# Patient Record
Sex: Male | Born: 1960 | Race: White | Hispanic: No | Marital: Single | Smoking: Current every day smoker
Health system: Southern US, Community
[De-identification: ages and names within clinical notes are randomized; demographics above are authoritative.]

## PROBLEM LIST (undated history)

## (undated) DIAGNOSIS — E785 Hyperlipidemia, unspecified: Secondary | ICD-10-CM

## (undated) DIAGNOSIS — K219 Gastro-esophageal reflux disease without esophagitis: Secondary | ICD-10-CM

## (undated) DIAGNOSIS — N400 Enlarged prostate without lower urinary tract symptoms: Secondary | ICD-10-CM

## (undated) DIAGNOSIS — K635 Polyp of colon: Secondary | ICD-10-CM

## (undated) DIAGNOSIS — IMO0002 Reserved for concepts with insufficient information to code with codable children: Secondary | ICD-10-CM

## (undated) DIAGNOSIS — K2970 Gastritis, unspecified, without bleeding: Secondary | ICD-10-CM

## (undated) DIAGNOSIS — K760 Fatty (change of) liver, not elsewhere classified: Secondary | ICD-10-CM

## (undated) DIAGNOSIS — K222 Esophageal obstruction: Secondary | ICD-10-CM

## (undated) DIAGNOSIS — Z86010 Personal history of colonic polyps: Secondary | ICD-10-CM

## (undated) DIAGNOSIS — K449 Diaphragmatic hernia without obstruction or gangrene: Secondary | ICD-10-CM

## (undated) HISTORY — DX: Benign prostatic hyperplasia without lower urinary tract symptoms: N40.0

## (undated) HISTORY — PX: TYMPANOSTOMY TUBE PLACEMENT: SHX32

## (undated) HISTORY — PX: TONSILLECTOMY: SUR1361

## (undated) HISTORY — DX: Gastritis, unspecified, without bleeding: K29.70

## (undated) HISTORY — DX: Polyp of colon: K63.5

## (undated) HISTORY — DX: Fatty (change of) liver, not elsewhere classified: K76.0

## (undated) HISTORY — DX: Diaphragmatic hernia without obstruction or gangrene: K44.9

## (undated) HISTORY — DX: Esophageal obstruction: K22.2

## (undated) HISTORY — DX: Personal history of colonic polyps: Z86.010

## (undated) HISTORY — DX: Hyperlipidemia, unspecified: E78.5

## (undated) HISTORY — DX: Gastro-esophageal reflux disease without esophagitis: K21.9

## (undated) HISTORY — DX: Reserved for concepts with insufficient information to code with codable children: IMO0002

---

## 2003-05-27 LAB — HM COLONOSCOPY

## 2004-02-16 ENCOUNTER — Encounter: Payer: Self-pay | Admitting: Internal Medicine

## 2004-04-10 ENCOUNTER — Ambulatory Visit: Payer: Self-pay | Admitting: Internal Medicine

## 2004-04-15 ENCOUNTER — Ambulatory Visit: Payer: Self-pay | Admitting: Gastroenterology

## 2004-04-29 ENCOUNTER — Ambulatory Visit: Payer: Self-pay | Admitting: Gastroenterology

## 2004-04-29 DIAGNOSIS — D126 Benign neoplasm of colon, unspecified: Secondary | ICD-10-CM

## 2004-04-29 DIAGNOSIS — Z860100 Personal history of colon polyps, unspecified: Secondary | ICD-10-CM

## 2004-04-29 DIAGNOSIS — K449 Diaphragmatic hernia without obstruction or gangrene: Secondary | ICD-10-CM | POA: Insufficient documentation

## 2004-04-29 HISTORY — DX: Personal history of colonic polyps: Z86.010

## 2004-04-29 HISTORY — DX: Personal history of colon polyps, unspecified: Z86.0100

## 2005-02-24 ENCOUNTER — Ambulatory Visit: Payer: Self-pay | Admitting: Internal Medicine

## 2005-03-03 ENCOUNTER — Ambulatory Visit: Payer: Self-pay

## 2005-07-21 ENCOUNTER — Ambulatory Visit: Payer: Self-pay | Admitting: Internal Medicine

## 2006-03-30 ENCOUNTER — Ambulatory Visit: Payer: Self-pay | Admitting: Internal Medicine

## 2006-06-01 ENCOUNTER — Ambulatory Visit: Payer: Self-pay | Admitting: Internal Medicine

## 2006-06-01 LAB — CONVERTED CEMR LAB
AST: 50 units/L — ABNORMAL HIGH (ref 0–37)
Cholesterol: 162 mg/dL (ref 0–200)
LDL Cholesterol: 77 mg/dL (ref 0–99)
PSA: 0.74 ng/mL (ref 0.10–4.00)

## 2006-06-15 ENCOUNTER — Ambulatory Visit: Payer: Self-pay | Admitting: Internal Medicine

## 2006-09-28 ENCOUNTER — Ambulatory Visit: Payer: Self-pay | Admitting: Internal Medicine

## 2006-09-28 LAB — CONVERTED CEMR LAB
ALT: 43 units/L — ABNORMAL HIGH (ref 0–40)
AST: 35 units/L (ref 0–37)
Albumin: 3.8 g/dL (ref 3.5–5.2)
HDL: 64.3 mg/dL (ref >39.0)
Total Bilirubin: 0.7 mg/dL (ref 0.3–1.2)
Triglycerides: 162 mg/dL — ABNORMAL HIGH (ref 0–149)

## 2006-10-26 ENCOUNTER — Ambulatory Visit: Payer: Self-pay | Admitting: Gastroenterology

## 2006-10-26 LAB — CONVERTED CEMR LAB
Basophils Absolute: 0 10*3/uL (ref 0.0–0.1)
Eosinophils Absolute: 0.3 10*3/uL (ref 0.0–0.6)
Eosinophils Relative: 2.8 % (ref 0.0–5.0)
HCV Ab: NEGATIVE
Hepatitis B Surface Ag: NEGATIVE
MCV: 98.7 fL (ref 78.0–100.0)
Monocytes Relative: 8.1 % (ref 3.0–11.0)
Platelets: 187 10*3/uL (ref 150–400)
RBC: 4.42 M/uL (ref 4.22–5.81)
Sed Rate: 4 mm/hr (ref 0–20)
WBC: 9.4 10*3/uL (ref 4.5–10.5)

## 2006-11-02 ENCOUNTER — Ambulatory Visit: Payer: Self-pay | Admitting: Gastroenterology

## 2006-11-09 ENCOUNTER — Encounter: Payer: Self-pay | Admitting: Internal Medicine

## 2006-11-09 ENCOUNTER — Ambulatory Visit: Payer: Self-pay | Admitting: Gastroenterology

## 2006-11-09 DIAGNOSIS — K222 Esophageal obstruction: Secondary | ICD-10-CM

## 2007-03-29 ENCOUNTER — Telehealth (INDEPENDENT_AMBULATORY_CARE_PROVIDER_SITE_OTHER): Payer: Self-pay | Admitting: *Deleted

## 2007-04-05 ENCOUNTER — Ambulatory Visit: Payer: Self-pay | Admitting: Internal Medicine

## 2007-04-05 LAB — CONVERTED CEMR LAB
ALT: 33 units/L (ref 0–53)
AST: 29 units/L (ref 0–37)
Alkaline Phosphatase: 50 units/L (ref 39–117)
Bilirubin, Direct: 0.1 mg/dL (ref 0.0–0.3)
Blood in Urine, dipstick: NEGATIVE
Eosinophils Absolute: 0.1 10*3/uL (ref 0.0–0.6)
Eosinophils Relative: 1.6 % (ref 0.0–5.0)
Glucose, Urine, Semiquant: NEGATIVE
HCT: 41 % (ref 39.0–52.0)
LDL Cholesterol: 93 mg/dL (ref 0–99)
Neutrophils Relative %: 51.6 % (ref 43.0–77.0)
Nitrite: NEGATIVE
Platelets: 207 10*3/uL (ref 150–400)
Protein, U semiquant: NEGATIVE
RBC: 4.03 M/uL — ABNORMAL LOW (ref 4.22–5.81)
RDW: 12.7 % (ref 11.5–14.6)
Total CHOL/HDL Ratio: 2.5
Total Protein: 6.5 g/dL (ref 6.0–8.3)
Triglycerides: 65 mg/dL (ref 0–149)
Urobilinogen, UA: NEGATIVE
VLDL: 13 mg/dL (ref 0–40)
WBC Urine, dipstick: NEGATIVE
WBC: 6.3 10*3/uL (ref 4.5–10.5)

## 2007-04-12 ENCOUNTER — Ambulatory Visit: Payer: Self-pay | Admitting: Internal Medicine

## 2007-04-12 DIAGNOSIS — E782 Mixed hyperlipidemia: Secondary | ICD-10-CM | POA: Insufficient documentation

## 2007-04-12 DIAGNOSIS — F17200 Nicotine dependence, unspecified, uncomplicated: Secondary | ICD-10-CM

## 2007-09-24 ENCOUNTER — Telehealth: Payer: Self-pay | Admitting: Internal Medicine

## 2007-09-28 ENCOUNTER — Ambulatory Visit: Payer: Self-pay | Admitting: Internal Medicine

## 2007-10-01 ENCOUNTER — Encounter: Payer: Self-pay | Admitting: Internal Medicine

## 2007-10-05 ENCOUNTER — Encounter (INDEPENDENT_AMBULATORY_CARE_PROVIDER_SITE_OTHER): Payer: Self-pay | Admitting: *Deleted

## 2007-10-11 ENCOUNTER — Encounter (INDEPENDENT_AMBULATORY_CARE_PROVIDER_SITE_OTHER): Payer: Self-pay | Admitting: *Deleted

## 2007-10-11 ENCOUNTER — Telehealth: Payer: Self-pay | Admitting: Internal Medicine

## 2007-10-21 DIAGNOSIS — Z862 Personal history of diseases of the blood and blood-forming organs and certain disorders involving the immune mechanism: Secondary | ICD-10-CM

## 2007-10-21 DIAGNOSIS — Z8639 Personal history of other endocrine, nutritional and metabolic disease: Secondary | ICD-10-CM

## 2007-10-21 DIAGNOSIS — J301 Allergic rhinitis due to pollen: Secondary | ICD-10-CM

## 2007-10-21 DIAGNOSIS — K219 Gastro-esophageal reflux disease without esophagitis: Secondary | ICD-10-CM

## 2007-10-25 ENCOUNTER — Telehealth: Payer: Self-pay | Admitting: Internal Medicine

## 2007-11-22 ENCOUNTER — Ambulatory Visit: Payer: Self-pay | Admitting: Internal Medicine

## 2008-02-28 ENCOUNTER — Telehealth (INDEPENDENT_AMBULATORY_CARE_PROVIDER_SITE_OTHER): Payer: Self-pay | Admitting: *Deleted

## 2008-03-06 ENCOUNTER — Ambulatory Visit: Payer: Self-pay | Admitting: Internal Medicine

## 2008-03-06 LAB — CONVERTED CEMR LAB
Blood in Urine, dipstick: NEGATIVE
Glucose, Urine, Semiquant: NEGATIVE
Influenza B Ag: NEGATIVE
Ketones, urine, test strip: NEGATIVE
Protein, U semiquant: NEGATIVE
Specific Gravity, Urine: 1.02
pH: 5

## 2008-03-07 ENCOUNTER — Encounter (INDEPENDENT_AMBULATORY_CARE_PROVIDER_SITE_OTHER): Payer: Self-pay | Admitting: *Deleted

## 2008-03-07 LAB — CONVERTED CEMR LAB
Albumin: 3.8 g/dL (ref 3.5–5.2)
Alkaline Phosphatase: 63 units/L (ref 39–117)
BUN: 8 mg/dL (ref 6–23)
Basophils Absolute: 0.1 10*3/uL (ref 0.0–0.1)
Chloride: 105 meq/L (ref 96–112)
Cholesterol: 181 mg/dL (ref 0–200)
Eosinophils Absolute: 0.1 10*3/uL (ref 0.0–0.7)
Eosinophils Relative: 1.3 % (ref 0.0–5.0)
GFR calc Af Amer: 115 mL/min
GFR calc non Af Amer: 95 mL/min
HCT: 44 % (ref 39.0–52.0)
HDL: 54.4 mg/dL (ref >39.0)
MCHC: 35.4 g/dL (ref 30.0–36.0)
MCV: 100.4 fL — ABNORMAL HIGH (ref 78.0–100.0)
Monocytes Absolute: 0.8 10*3/uL (ref 0.1–1.0)
Neutrophils Relative %: 66 % (ref 43.0–77.0)
Platelets: 189 10*3/uL (ref 150–400)
Potassium: 4.3 meq/L (ref 3.5–5.1)
RDW: 12.6 % (ref 11.5–14.6)
Sodium: 140 meq/L (ref 135–145)
Total Bilirubin: 1.3 mg/dL — ABNORMAL HIGH (ref 0.3–1.2)
Triglycerides: 103 mg/dL (ref 0–149)
VLDL: 21 mg/dL (ref 0–40)

## 2008-03-26 ENCOUNTER — Encounter: Payer: Self-pay | Admitting: Internal Medicine

## 2008-03-27 ENCOUNTER — Ambulatory Visit: Payer: Self-pay | Admitting: Internal Medicine

## 2008-03-27 DIAGNOSIS — R9431 Abnormal electrocardiogram [ECG] [EKG]: Secondary | ICD-10-CM

## 2008-03-27 LAB — CONVERTED CEMR LAB
HDL goal, serum: 40 mg/dL
LDL Goal: 90 mg/dL

## 2008-03-29 ENCOUNTER — Encounter (INDEPENDENT_AMBULATORY_CARE_PROVIDER_SITE_OTHER): Payer: Self-pay | Admitting: *Deleted

## 2008-04-12 ENCOUNTER — Ambulatory Visit: Payer: Self-pay | Admitting: Internal Medicine

## 2008-04-13 ENCOUNTER — Telehealth (INDEPENDENT_AMBULATORY_CARE_PROVIDER_SITE_OTHER): Payer: Self-pay | Admitting: *Deleted

## 2008-05-18 ENCOUNTER — Ambulatory Visit: Payer: Self-pay | Admitting: Gastroenterology

## 2008-05-18 LAB — CONVERTED CEMR LAB
Iron: 184 ug/dL — ABNORMAL HIGH (ref 42–165)
Saturation Ratios: 62.5 % — ABNORMAL HIGH (ref 20.0–50.0)
Tissue Transglutaminase Ab, IgA: 1.6 units (ref <7)

## 2008-06-13 ENCOUNTER — Telehealth: Payer: Self-pay | Admitting: Gastroenterology

## 2008-08-21 ENCOUNTER — Ambulatory Visit: Payer: Self-pay | Admitting: Internal Medicine

## 2008-08-21 DIAGNOSIS — N400 Enlarged prostate without lower urinary tract symptoms: Secondary | ICD-10-CM

## 2008-10-02 ENCOUNTER — Encounter: Payer: Self-pay | Admitting: Internal Medicine

## 2008-12-04 ENCOUNTER — Ambulatory Visit: Payer: Self-pay | Admitting: Internal Medicine

## 2009-01-09 ENCOUNTER — Ambulatory Visit: Payer: Self-pay | Admitting: Gastroenterology

## 2009-01-10 ENCOUNTER — Telehealth (INDEPENDENT_AMBULATORY_CARE_PROVIDER_SITE_OTHER): Payer: Self-pay | Admitting: *Deleted

## 2009-01-10 ENCOUNTER — Ambulatory Visit: Payer: Self-pay | Admitting: Gastroenterology

## 2009-01-10 LAB — CONVERTED CEMR LAB
Albumin: 4.2 g/dL (ref 3.5–5.2)
BUN: 10 mg/dL (ref 6–23)
CO2: 33 meq/L — ABNORMAL HIGH (ref 19–32)
Calcium: 8.8 mg/dL (ref 8.4–10.5)
Creatinine, Ser: 0.8 mg/dL (ref 0.4–1.5)
Eosinophils Relative: 1.3 % (ref 0.0–5.0)
Ferritin: 95 ng/mL (ref 22.0–322.0)
Glucose, Bld: 104 mg/dL — ABNORMAL HIGH (ref 70–99)
HCT: 44.9 % (ref 39.0–52.0)
Hemoglobin: 15.5 g/dL (ref 13.0–17.0)
Lymphs Abs: 2.1 10*3/uL (ref 0.7–4.0)
MCV: 99.8 fL (ref 78.0–100.0)
Monocytes Relative: 7.1 % (ref 3.0–12.0)
Neutro Abs: 4.9 10*3/uL (ref 1.4–7.7)
RDW: 11.9 % (ref 11.5–14.6)
Saturation Ratios: 83.7 % — ABNORMAL HIGH (ref 20.0–50.0)
TSH: 1.98 microintl units/mL (ref 0.35–5.50)
Total Protein: 7 g/dL (ref 6.0–8.3)
Vitamin B-12: 472 pg/mL (ref 211–911)
WBC: 7.8 10*3/uL (ref 4.5–10.5)

## 2009-01-11 ENCOUNTER — Telehealth (INDEPENDENT_AMBULATORY_CARE_PROVIDER_SITE_OTHER): Payer: Self-pay | Admitting: *Deleted

## 2009-01-15 ENCOUNTER — Telehealth: Payer: Self-pay | Admitting: Gastroenterology

## 2009-01-16 ENCOUNTER — Ambulatory Visit: Payer: Self-pay | Admitting: Gastroenterology

## 2009-02-19 ENCOUNTER — Ambulatory Visit: Payer: Self-pay | Admitting: Gastroenterology

## 2009-02-19 ENCOUNTER — Telehealth: Payer: Self-pay | Admitting: Gastroenterology

## 2009-02-19 LAB — CONVERTED CEMR LAB
Basophils Relative: 1.5 % (ref 0.0–3.0)
Eosinophils Absolute: 0.1 10*3/uL (ref 0.0–0.7)
Hemoglobin: 14.8 g/dL (ref 13.0–17.0)
Lymphocytes Relative: 32.6 % (ref 12.0–46.0)
MCHC: 34.2 g/dL (ref 30.0–36.0)
MCV: 100.5 fL — ABNORMAL HIGH (ref 78.0–100.0)
Neutro Abs: 4.3 10*3/uL (ref 1.4–7.7)
RBC: 4.3 M/uL (ref 4.22–5.81)
Saturation Ratios: 27.2 % (ref 20.0–50.0)

## 2009-04-23 ENCOUNTER — Ambulatory Visit: Payer: Self-pay | Admitting: Internal Medicine

## 2009-05-01 ENCOUNTER — Telehealth (INDEPENDENT_AMBULATORY_CARE_PROVIDER_SITE_OTHER): Payer: Self-pay | Admitting: *Deleted

## 2009-05-01 ENCOUNTER — Encounter (INDEPENDENT_AMBULATORY_CARE_PROVIDER_SITE_OTHER): Payer: Self-pay | Admitting: *Deleted

## 2009-06-25 ENCOUNTER — Ambulatory Visit: Payer: Self-pay | Admitting: Internal Medicine

## 2009-06-25 LAB — CONVERTED CEMR LAB

## 2009-06-29 ENCOUNTER — Telehealth: Payer: Self-pay | Admitting: Internal Medicine

## 2009-07-02 LAB — CONVERTED CEMR LAB
ALT: 60 units/L — ABNORMAL HIGH (ref 0–53)
AST: 46 units/L — ABNORMAL HIGH (ref 0–37)
Cholesterol: 190 mg/dL (ref 0–200)
HDL: 61.1 mg/dL (ref >39.00)
Total Protein: 6.6 g/dL (ref 6.0–8.3)
VLDL: 20 mg/dL (ref 0.0–40.0)

## 2009-11-30 ENCOUNTER — Telehealth: Payer: Self-pay | Admitting: Internal Medicine

## 2010-02-21 ENCOUNTER — Telehealth: Payer: Self-pay | Admitting: Gastroenterology

## 2010-02-23 DIAGNOSIS — I4892 Unspecified atrial flutter: Secondary | ICD-10-CM

## 2010-02-23 DIAGNOSIS — IMO0002 Reserved for concepts with insufficient information to code with codable children: Secondary | ICD-10-CM

## 2010-02-23 HISTORY — DX: Unspecified atrial flutter: I48.92

## 2010-02-23 HISTORY — DX: Reserved for concepts with insufficient information to code with codable children: IMO0002

## 2010-03-04 ENCOUNTER — Encounter: Payer: Self-pay | Admitting: Internal Medicine

## 2010-03-04 ENCOUNTER — Encounter: Payer: Self-pay | Admitting: Gastroenterology

## 2010-03-04 ENCOUNTER — Encounter (INDEPENDENT_AMBULATORY_CARE_PROVIDER_SITE_OTHER): Payer: Self-pay | Admitting: *Deleted

## 2010-03-04 ENCOUNTER — Ambulatory Visit: Payer: Self-pay | Admitting: Internal Medicine

## 2010-03-04 ENCOUNTER — Telehealth: Payer: Self-pay | Admitting: Internal Medicine

## 2010-03-04 DIAGNOSIS — I4891 Unspecified atrial fibrillation: Secondary | ICD-10-CM

## 2010-03-04 LAB — CONVERTED CEMR LAB
Cholesterol: 190 mg/dL
Creatinine, Ser: 0.78 mg/dL
Platelets: 183 10*3/uL
Sodium, serum: 136 mmol/L

## 2010-03-05 ENCOUNTER — Encounter: Payer: Self-pay | Admitting: Internal Medicine

## 2010-03-05 ENCOUNTER — Encounter: Payer: Self-pay | Admitting: Gastroenterology

## 2010-03-26 ENCOUNTER — Ambulatory Visit: Payer: Self-pay | Admitting: Gastroenterology

## 2010-03-26 DIAGNOSIS — R079 Chest pain, unspecified: Secondary | ICD-10-CM

## 2010-03-27 LAB — CONVERTED CEMR LAB
ALT: 35 units/L (ref 0–53)
BUN: 12 mg/dL (ref 6–23)
Basophils Absolute: 0 10*3/uL (ref 0.0–0.1)
Bilirubin, Direct: 0.1 mg/dL (ref 0.0–0.3)
CO2: 31 meq/L (ref 19–32)
Eosinophils Relative: 2.7 % (ref 0.0–5.0)
Ferritin: 66.2 ng/mL (ref 22.0–322.0)
Folate: 10.7 ng/mL
GFR calc non Af Amer: 103.71 mL/min (ref >60)
Glucose, Bld: 97 mg/dL (ref 70–99)
Iron: 118 ug/dL (ref 42–165)
MCV: 101.9 fL — ABNORMAL HIGH (ref 78.0–100.0)
Monocytes Absolute: 1 10*3/uL (ref 0.1–1.0)
Monocytes Relative: 12 % (ref 3.0–12.0)
Neutrophils Relative %: 60.3 % (ref 43.0–77.0)
Platelets: 209 10*3/uL (ref 150.0–400.0)
Potassium: 4.8 meq/L (ref 3.5–5.1)
RDW: 13.2 % (ref 11.5–14.6)
Saturation Ratios: 35.2 % (ref 20.0–50.0)
Sodium: 141 meq/L (ref 135–145)
Total Bilirubin: 0.9 mg/dL (ref 0.3–1.2)
Transferrin: 239.3 mg/dL (ref 212.0–360.0)
WBC: 8.7 10*3/uL (ref 4.5–10.5)

## 2010-04-08 ENCOUNTER — Ambulatory Visit (HOSPITAL_COMMUNITY): Admission: RE | Admit: 2010-04-08 | Discharge: 2010-04-08 | Payer: Self-pay | Admitting: Gastroenterology

## 2010-04-29 ENCOUNTER — Ambulatory Visit: Payer: Self-pay | Admitting: Internal Medicine

## 2010-04-29 LAB — CONVERTED CEMR LAB: PSA: 0.96 ng/mL (ref 0.10–4.00)

## 2010-05-16 ENCOUNTER — Encounter (INDEPENDENT_AMBULATORY_CARE_PROVIDER_SITE_OTHER): Payer: Self-pay | Admitting: *Deleted

## 2010-05-23 ENCOUNTER — Ambulatory Visit
Admission: RE | Admit: 2010-05-23 | Discharge: 2010-05-23 | Payer: Self-pay | Source: Home / Self Care | Attending: Internal Medicine | Admitting: Internal Medicine

## 2010-05-23 DIAGNOSIS — J069 Acute upper respiratory infection, unspecified: Secondary | ICD-10-CM | POA: Insufficient documentation

## 2010-06-13 ENCOUNTER — Encounter: Payer: Self-pay | Admitting: Internal Medicine

## 2010-06-23 LAB — CONVERTED CEMR LAB
AST: 33 units/L (ref 0–37)
Albumin: 3.8 g/dL (ref 3.5–5.2)
Albumin: 4 g/dL (ref 3.5–5.2)
Alkaline Phosphatase: 56 units/L (ref 39–117)
Alkaline Phosphatase: 60 units/L (ref 39–117)
Alkaline Phosphatase: 62 units/L (ref 39–117)
BUN: 8 mg/dL (ref 6–23)
BUN: 9 mg/dL (ref 6–23)
Basophils Absolute: 0 10*3/uL (ref 0.0–0.1)
Bilirubin, Direct: 0.1 mg/dL (ref 0.0–0.3)
Bilirubin, Direct: 0.1 mg/dL (ref 0.0–0.3)
CO2: 30 meq/L (ref 19–32)
Calcium: 9.1 mg/dL (ref 8.4–10.5)
Chol/HDL Ratio, serum: 2.8
Creatinine, Ser: 0.9 mg/dL (ref 0.4–1.5)
Direct LDL: 162.6 mg/dL
GFR calc non Af Amer: 94.81 mL/min (ref >60)
Glomerular Filtration Rate, Af Am: 116 mL/min/{1.73_m2}
Glucose, Bld: 90 mg/dL (ref 70–99)
Glucose, Bld: 93 mg/dL (ref 70–99)
HDL goal, serum: 40 mg/dL
HDL: 59.2 mg/dL (ref >39.00)
Hemoglobin: 15.3 g/dL (ref 13.0–17.0)
LDL Cholesterol: 114 mg/dL — ABNORMAL HIGH (ref 0–99)
LDL Goal: 130 mg/dL
Monocytes Relative: 9.2 % (ref 3.0–11.0)
PSA: 1.64 ng/mL (ref 0.10–4.00)
Platelets: 186 10*3/uL (ref 150.0–400.0)
Platelets: 213 10*3/uL (ref 150–400)
Potassium: 4.6 meq/L (ref 3.5–5.1)
RBC: 4.81 M/uL (ref 4.22–5.81)
RDW: 12.8 % (ref 11.5–14.6)
RDW: 12.9 % (ref 11.5–14.6)
Sodium: 141 meq/L (ref 135–145)
Total Bilirubin: 1 mg/dL (ref 0.3–1.2)
Total Bilirubin: 1.1 mg/dL (ref 0.3–1.2)
Total Protein: 6.8 g/dL (ref 6.0–8.3)
Triglycerides: 87 mg/dL (ref 0.0–149.0)

## 2010-06-25 NOTE — Progress Notes (Signed)
Summary: Triage: Knot in stomach(lmom 7/11)  Phone Note Call from Patient Call back at Home Phone [phone]   Caller: Patient Summary of Call:  Message left on Triage VM: Knot in stomach, patient discussed with Dr.Jahni Nazar before. Patient was DX with a Hiatal Hernia. Patient know with pain from knot and urge to vomit. Patient has a GI doctor and not sure if he should contact them or [place]. If patient needs to be seen by [place] Monday would be the earliest he could come in.   Chrae Malloy  November 30, 2009 10:44 AM   Follow-up for Phone Call        take Omeprazole two times a day  30 min pre meals. If already doing this & no better he needs to be seen by me or GI Follow-up by: Marga Melnick MD,  November 30, 2009 2:37 PM  Additional Follow-up for Phone Call Additional follow up Details #1::        Left message for pt to call back. Army Fossa CMA  December 03, 2009 4:40 PM     Additional Follow-up for Phone Call Additional follow up Details #2::    Pt is taking Priolsec. Pt will call his GI doc.Army Fossa CMA  December 03, 2009 5:01 PM

## 2010-06-25 NOTE — Progress Notes (Signed)
Summary: Lab Result Request  Phone Note Call from Patient Call back at Home Phone [phone]   Caller: Patient Summary of Call: Message left on VM: Patient would like lab results  I called and Left message on VM informing patient labs to be reviewed by the Dr and then I will mail results to him. If patient with specific questions he can call and I will answer them. Initial call taken by: Shonna Chock,  June 29, 2009 4:02 PM  Follow-up for Phone Call        I spoke with patient and he was wondering if his LDL was better, compared to labs in 04/23/2009, LDL is better. Patient would also like to know if LFT was ok. It looked like it was slightly elevated. Patient aware Instruction will come from Dr.Trejon Duford reguarding LFT's. Patient aware I will mail labs as soon as they are signed off on.  Follow-up by: Shonna Chock,  June 29, 2009 4:08 PM  Additional Follow-up for Phone Call Additional follow up Details #1::        I left message on home phone  stating minor changes of no concern but F/U necessary. Hard copy to be mailed. Additional Follow-up by: Marga Melnick MD,  June 30, 2009 1:14 PM

## 2010-06-25 NOTE — Progress Notes (Signed)
Summary: Abd Pain   Phone Note Call from Patient Call back at [phone]   Call For: Dr Jarold Motto Reason for Call: Talk to Nurse Summary of Call: Having alot of abd pain. Initial call taken by: Leanor Kail [facility],  February 21, 2010 3:02 PM  Follow-up for Phone Call        patient c/o occasional dysphagia, Chest pain, GERD.  He would like to discuss a EGD with Dr Jarold Motto.  REV scheduled for 03/26/10 3:00 Follow-up by: Darcey Nora RN, CGRN,  February 21, 2010 3:25 PM

## 2010-06-25 NOTE — Assessment & Plan Note (Signed)
Summary: HEART RACING, DOESNT WANT TO GO TO ER--WANTS TO KEEP APPT--PUT   Vital Signs:  Patient profile:   50 year old male Height:      74.75 inches Weight:      211.25 pounds BMI:     26.68 Pulse rate:   81 / minute Pulse rhythm:   regular BP sitting:   128 / 84  (left arm) Cuff size:   large  Vitals Entered By: Army Fossa CMA (March 04, 2010 2:04 PM) CC: Pt here c/o his heart has been racing since last night.  Comments Chest sore to touch Has has diarrhea for 2 days Nauseas pharm- [facility]   History of Present Illness: woke up yesterday with palpitations along with diarrhea, cough , flashes and sweats. He was able to sleep okay, this morning he woke up with the same symptoms. At the time of this evaluation ( 2.43 PM) he's feeling a little better.  ROS  No fever No cough or shortness of breath He admits to some soreness in the left side of the chest and a ill-defined left arm tingling and tongue discomfort   Current Medications (verified): 1)  Saw Palmetto 1200 Mg .... Take 1 Tablet By Mouth Once A Day 2)  Red Clover 1000 Mg .... Take 1 Tablet By Mouth Once A Day 3)  Quercetin 50 Mg Tabs (Quercetin) .... Take 1 Tab Once Daily 4)  Salmon Oil-1000 200 Mg Caps (Omega-3 Fatty Acids) .... Take 1 Tab Once Daily 5)  Red Yeast Rice 600 Mg Tabs (Red Yeast Rice Extract) .... Take 1 Tab Once Daily 6)  Omeprazole 20 Mg Tbec (Omeprazole) .Marland Kitchen.. 1 Two Times A Day Pre Meals 7)  Pygeum Bark Extract 60 Mg .... Take 1 Tablet By Mouth Once A Day 8)  Aspirin 325 Mg Tabs (Aspirin) .... Take 1 Tablet By Mouth Once A Day 9)  Hawthorn Berries 565 Mg .... Take 1 Tablet By Mouth Once A Day 10)  Rose Hips 25mg  .... Take 1 Tablet By Mouth Once A Day 11)  Resveratrol 250 Mg .... Take 1 Tablet By Mouth Once A Day 12)  Acai 1000 Mg .... Take 1 Tablet By Mouth Once A Day 13)  Concentrace 300 Mg .... Take 1 Tablet By Mouth Once A Day 14)  Milk Thistle Extract 170 Mg .... Take 1 Tablet By  Mouth Once A Day 15)  Dandelion 200 Mg .... Take 1 Tablet By Mouth Once A Day 16)  Inositol 500 Mg .... Take 1 Tablet By Mouth Once A Day 17)  Coq-10 200 Mg Caps (Coenzyme Q10) .... Take 2 Tablets By Mouth Once Daily  Allergies (verified): 1)  ! 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 Tonsillectomy Otic Tubes bilaterally for recurrent otitis Colonoscopy  hyperplastic polyps 2002; 2005 : negative ; Endo 2005; HH,ERD ,chronic gastritis, esophageal stricture, duodenitis; Dilation X 1 in  2005  Social History: Reviewed history from 04/23/2009 and no changes required. 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 -2 cigarettes daily Daily Caffeine Use-1  Physical Exam  General:  alert, well-developed, and well-nourished.  no apparent distress Neck:  no thyromegaly Lungs:  normal respiratory effort, no intercostal retractions, no accessory muscle use, and normal breath sounds.   Heart:  irregularly irregular heartbeat Abdomen:  soft, non-tender, no distention, no masses, no guarding, and no rigidity.   Extremities:  no edema Psych:  Oriented X3, memory intact for recent and remote, normally interactive, good eye contact, not anxious appearing, and not depressed appearing.     Impression & Recommendations:  Problem # 1:  FIBRILLATION, ATRIAL (ICD-427.31)  new onset of atrial fibrillation, symptomatic. Etiology unclear, DDX includes CAD, thyroid disease, drug induced (he takes many many  over-the-counter supplements) et Karie Soda. Plan: Admit to the hospital via ambulance Consult cardiology  Aspirin 81 mg x4 now  Addendum The patient requested me to talk over the phone with Dr. Melina Fiddler a cardiovascular surgeon, we discussed the case, he stated that he will come to the office and pick him up. Again, in no unclear terms, I advised the patient that he needs to be admitted immediately for further workup. His updated medication list for this problem includes:    Aspirin 325 Mg Tabs (Aspirin) .Marland Kitchen... Take 1 tablet by mouth once a day  Orders: EKG w/ Interpretation (93000)  Complete Medication List: 1)  Saw Palmetto 1200 Mg  .... Take 1 tablet by mouth once a day 2)  Red Clover 1000 Mg  .... Take 1 tablet by mouth once a day 3)  Quercetin 50 Mg Tabs (Quercetin) .... Take 1 tab once daily 4)  Salmon Oil-1000 200 Mg Caps (Omega-3 fatty acids) .... Take 1 tab once daily 5)  Red Yeast Rice 600 Mg Tabs (Red yeast rice extract) .... Take 1 tab once daily 6)  Omeprazole 20 Mg Tbec (Omeprazole) .Marland Kitchen.. 1 two times a day pre meals 7)  Pygeum Bark Extract 60 Mg  .... Take 1 tablet by mouth once a day 8)  Aspirin 325 Mg Tabs (Aspirin) .... Take 1 tablet by mouth once a day 9)  Hawthorn Berries 565 Mg  .... Take 1 tablet by mouth once a day 10)  Rose Hips 25mg   .... Take 1 tablet by mouth once a day 11)  Resveratrol 250 Mg  .... Take 1 tablet by mouth once a day 12)  Acai 1000 Mg  .... Take 1 tablet by mouth once a day 13)  Concentrace 300 Mg  .... Take 1 tablet by mouth once a day 14)  Milk Thistle Extract 170 Mg  .... Take 1 tablet by mouth once a day 15)  Dandelion 200 Mg  .... Take 1 tablet by mouth once a day 16)  Inositol 500 Mg  .... Take 1 tablet by mouth once a day 17)  Coq-10 200 Mg Caps (Coenzyme q10) .... Take 2 tablets by mouth once daily

## 2010-06-25 NOTE — Assessment & Plan Note (Signed)
Summary: dysphagia, chest pain/sheri    History of Present Illness Visit Type: Follow-up Visit Primary GI MD: Sheryn Bison MD FACP FAGA Primary Provider: Willow Ora, MD  Requesting Provider: na Chief Complaint: Pt was having dysphagia and chest pain but went to [facility] and feels better since then  History of Present Illness:   Samuel Golden is a 50 year old white male who was recently diagnosed with atrial fibrillation after an episode of chest pain and palpitations which took him to [facility]. He underwent Cardiolite stress checking and 2-D echocardiogram and was placed on Pradaxa and 50 mg a day,Flecinide 50 mg b.i.d., and diltiazem CR 240 mg daily.He Currently is asymptomatic except for occasional glass of reflux relieved by omeprazole 20 mg twice a day. He denies a specific epidural or complaints with dysphagia.  His past workup included ultrasonography several years ago, endoscopy, and he has periodic abnormal liver function tests with elevated iron levels but negative DNA exams for hemochromatosis.hepatobiliary problems or lower gastrointestinal difficulties. Review of recent labs from his emergency room visit shows normal CBC and cardiac enzymes but noted liver profile. He denies anorexia, weight loss, arthritis, skin rashes, or other systemic complaints.   GI Review of Systems      Denies abdominal pain, acid reflux, belching, bloating, chest pain, dysphagia with liquids, dysphagia with solids, heartburn, loss of appetite, nausea, vomiting, vomiting blood, weight loss, and  weight gain.        Denies anal fissure, black tarry stools, change in bowel habit, constipation, diarrhea, diverticulosis, fecal incontinence, heme positive stool, hemorrhoids, irritable bowel syndrome, jaundice, light color stool, liver problems, rectal bleeding, and  rectal pain.    Current Medications (verified): 1)  Omeprazole 20 Mg Tbec (Omeprazole) .Marland Kitchen.. 1 Two Times A Day Pre Meals 2)   Pradaxa 150 Mg Caps (Dabigatran Etexilate Mesylate) .... One Tablet By Mouth Two Times A Day 3)  Flecainide Acetate 50 Mg Tabs (Flecainide Acetate) .... One Tablet By Mouth Two Times A Day 4)  Diltiazem Hcl Cr 240 Mg Xr24h-Cap (Diltiazem Hcl) .... One Tablet By Mouth Once Daily 5)  Multivitamins   Tabs (Multiple Vitamin) .... One Tablet By Mouth Once Daily  Allergies (verified): 1)  ! Codeine  Past History:  Past medical, surgical, family and social histories (including risk factors) reviewed for relevance to current acute and chronic problems.  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 Tonsillectomy Otic Tubes bilaterally for recurrent otitis Colonoscopy  hyperplastic polyps 2002; 2005 : negative ; Endo 2005; HH,ERD ,chronic gastritis, esophageal stricture, duodenitis; Dilation X 1 in  2005  Family History: Reviewed history from 04/23/2009 and no changes required. Family History Diabetes 1st degree relative Family History Hypertension Father: MI @ 60, prostate CA Mother: HTN, lipids, coronary stents, DM Siblings: neg; MGM DM; M aunts & M uncles DM No FH of Colon Cancer: Family History of Diabetes: cousins; Family History of Heart Disease: Father, uncles, cousins Family History of Colitis/Crohn's: Aunt x 2  Social History: Reviewed history from 04/23/2009 and no changes  required. 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 -2 cigarettes daily Daily Caffeine Use-1  Review of Systems       The patient complains of sore throat.  The patient denies allergy/sinus, anemia, anxiety-new, arthritis/joint pain, back pain, blood in urine, breast changes/lumps, change in vision, confusion, cough, coughing up blood, depression-new, fainting, fatigue, fever, headaches-new, hearing problems, heart murmur, heart rhythm changes, itching, menstrual pain, muscle pains/cramps, night sweats, nosebleeds, pregnancy symptoms, shortness of breath, skin rash, sleeping problems, swelling of feet/legs, swollen lymph glands, thirst - excessive , urination - excessive , urination changes/pain, urine leakage, vision changes, and voice change.    Vital Signs:  Patient profile:   50 year old male Height:      74.75 inches Weight:      214 pounds BMI:     27.02 BSA:     2.25 Pulse rate:   88 / minute Pulse rhythm:   regular BP sitting:   124 / 68  (left arm) Cuff size:   regular  Vitals Entered By: Ok Anis CMA (March 26, 2010 3:21 PM)  Physical Exam  General:  Well developed, well nourished, no acute distress.healthy appearing.   Head:  Normocephalic and atraumatic. Eyes:  PERRLA, no icterus.exam deferred to patient's ophthalmologist.   Neck:  Supple; no masses or thyromegaly. Lungs:  Clear throughout to auscultation. Heart:  Regular rate and rhythm; no murmurs, rubs,  or bruits. Abdomen:  Soft, nontender and nondistended. No masses, hepatosplenomegaly or hernias noted. Normal bowel sounds. Extremities:  No clubbing, cyanosis, edema or deformities noted. Neurologic:  Alert and  oriented x4;  grossly normal neurologically. Cervical Nodes:  No significant cervical adenopathy. Psych:  Alert and cooperative. Normal mood and affect.   Impression &  Recommendations:  Problem # 1:  CHEST PAIN (ICD-786.50) Assessment Improved Chest Pain possibly from recurrent episodes of atrial fibrillation. heHe currently is under expert cardiac care and appears to be in a regular rhythm.Have Scheduled upper abdominal ultrasound exam to evaluate his liver and exclude cholelithiasis. Followup labs and liver enzymes have also been ordered. If he continues anti-reflex maneuvers and b.i.d. omeprazole.  Orders: TLB-CBC Platelet - w/Differential (85025-CBCD) TLB-Hepatic/Liver Function Pnl (80076-HEPATIC) TLB-BMP (Basic Metabolic Panel-BMET) (80048-METABOL) TLB-TSH (Thyroid Stimulating Hormone) (84443-TSH) TLB-B12 + Folate Pnl (16109_60454-U98/JXB) TLB-Ferritin (82728-FER) TLB-IBC Pnl (Iron/FE;Transferrin) (83550-IBC)  Problem # 2:  FIBRILLATION, ATRIAL (ICD-427.31) Assessment: Improved Continue Cardiac Medications As Reviewed.  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 Orders:  TLB-CBC Platelet - w/Differential (85025-CBCD) TLB-Hepatic/Liver Function Pnl (80076-HEPATIC) TLB-BMP (Basic Metabolic Panel-BMET) (80048-METABOL) TLB-TSH (Thyroid Stimulating Hormone) (84443-TSH) TLB-B12 + Folate Pnl (14782_95621-H08/MVH) TLB-Ferritin (82728-FER) TLB-IBC Pnl (Iron/FE;Transferrin) (83550-IBC)  Other Orders: Ultrasound Abdomen (UAS)  Patient Instructions: 1)  Copy sent to : Willow Ora, MD 2)  Please go to the basement today for your labs.  3)  Your abdominal ultrasound is scheduled for 04/01/2010 please follow seperate instructions.  4)  The medication list was reviewed and reconciled.  All changed / newly prescribed medications were explained.  A complete medication list was provided to the patient / caregiver. 5)  Please continue current  medications.  6)  Avoid foods high in acid content ( tomatoes, citrus juices, spicy foods) . Avoid eating within 3 to 4 hours of lying down or before exercising.  Do not over eat; try smaller more frequent meals. Elevate head of bed four inches when sleeping.

## 2010-06-25 NOTE — Assessment & Plan Note (Signed)
Summary: CPX//PH   Vital Signs:  Patient profile:   50 year old male Height:      74.75 inches (189.87 cm) Weight:      217 pounds (98.64 kg) BMI:     27.40 O2 Sat:      97 % on Room air Temp:     98.7 degrees F (37.06 degrees C) oral Pulse rate:   60 / minute BP sitting:   120 / 74  (left arm) Cuff size:   regular  Vitals Entered By: Lucious Groves CMA (April 29, 2010 12:41 PM)  O2 Flow:  Room air CC: CPX-not fasting./kb   History of Present Illness: Complete physical exam  In October 2011, he was seen here with a new onset of atrial fibrillation. He was admitted to [facility] The patient reports that he quickly converted to sinus rhythm He is currently on Pradaxa,   Flecainide and verapamil  Records are reviewed: Hemoglobin was 14.0, potassium 3.4, creatinine 0.7, calcium 8.5 Cardiac enzymes negative Total cholesterol 190, triglyceride 108, LDL one line TSH 1.5 Chest x-ray negative He had a negative stress test and echocardiogram  Current Medications (verified): 1)  Omeprazole 20 Mg Tbec (Omeprazole) .Marland Kitchen.. 1 Two Times A Day Pre Meals 2)  Pradaxa 150 Mg Caps (Dabigatran Etexilate Mesylate) .... One Tablet By Mouth Two Times A Day 3)  Flecainide Acetate 50 Mg Tabs (Flecainide Acetate) .... One Tablet By Mouth Two Times A Day 4)  Diltiazem Hcl Cr 240 Mg Xr24h-Cap (Diltiazem Hcl) .... One Tablet By Mouth Once Daily 5)  Multivitamins   Tabs (Multiple Vitamin) .... One Tablet By Mouth Once Daily 6)  Salmon Oil .... As Directed 7)  Co Q 10 .... As Directed  Allergies (verified): 1)  ! 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 -2 cigarettes daily Daily Caffeine Use-1  Review of Systems General:  Denies fatigue and fever; some wt gain . CV:  Denies palpitations and swelling of feet. Resp:  Denies cough and shortness of breath. GI:  Denies bloody stools, diarrhea, nausea, and vomiting. GU:  Denies hematuria, urinary frequency, and urinary hesitancy. Psych:  Denies anxiety and depression.  Physical Exam  General:  alert, well-developed, and well-nourished.   Neck:  no masses, no thyromegaly, and normal carotid upstroke.   Lungs:  normal respiratory effort, no intercostal retractions, no accessory muscle use, and normal breath sounds.   Heart:  normal rate, regular rhythm, and no murmur.   Abdomen:  soft, non-tender, no distention, no masses, no guarding, and no rigidity.   Rectal:  No external abnormalities noted. Normal sphincter tone. No rectal masses or tenderness. Prostate:  no nodules, no asymmetry, and no induration.  gland is slightly enlarged Extremities:  no lower ext edema    Impression & Recommendations:  Problem # 1:  PHYSICAL EXAMINATION (ICD-V70.0) Td  ~ 2006 Declined a flu shot, explained the benefits  Colonoscopy per GI  Check a PSA,  per patient request a HIV as well although he has not been sexually active in long-time per patient Recent labs reviewed, see HPI, cholesterol satisfactory  diet and exercise  discussed  Orders: Venipuncture (16109) TLB-PSA (Prostate Specific Antigen) (84153-PSA) T-HIV Antibody  (Reflex) ([id number]) Specimen Handling (91478)  Problem # 2:  FIBRILLATION, ATRIAL (ICD-427.31) transient atrial fibrillation diagnosed  02-2010 Workup at the hospital included a negative echo and stress test he saw his cardiologist recently, he suggested an  ablation. Patient not enthusiastic about it, he wondered even if he should discontinue all meds since he only had one episode of A. fib. His plan at this point is to continue with present meds and see his heart doctor in January 2012 and then decide what to do. I told him that there may be different opinions on what to do in this situation depending on who he talks to. I encouraged him to discuss further with cardiology, if he desires a second opinion  I will arrange that.   His updated medication list for this problem includes:    Flecainide Acetate 50 Mg Tabs (Flecainide acetate) ..... One tablet by mouth two times a day    Diltiazem Hcl Cr 240 Mg Xr24h-cap (Diltiazem hcl) ..... One tablet by mouth once daily  Complete Medication List: 1)  Omeprazole 20 Mg Tbec (Omeprazole) .Marland Kitchen.. 1 two times a day pre meals 2)  Pradaxa 150 Mg Caps (Dabigatran etexilate mesylate) .... One tablet by mouth two times a day 3)  Flecainide Acetate 50 Mg Tabs (Flecainide acetate) .... One tablet by mouth two times a day 4)  Diltiazem Hcl Cr 240 Mg Xr24h-cap (Diltiazem hcl) .... One tablet by mouth once daily 5)  Multivitamins Tabs (Multiple vitamin) .... One tablet by mouth once daily 6)  Salmon Oil  .... As directed 7)  Co Q 10  .... As directed  Patient Instructions: 1)  Please schedule a follow-up appointment in 1 year.    Orders Added: 1)  Venipuncture [36415] 2)  TLB-PSA (Prostate Specific Antigen) [[id number]] 3)  T-HIV Antibody  (Reflex) [[id number]] 4)  Specimen Handling [99000] 5)  Est. Patient age 65-64 [99396]   Immunization  History:  Tetanus/Td Immunization History:    Tetanus/Td:  per pt (04/25/2005)  Not Administered:    Influenza Vaccine # 1 not given due to: declined   Immunization History:  Tetanus/Td Immunization History:    Tetanus/Td:  per pt (04/25/2005)

## 2010-06-25 NOTE — Progress Notes (Signed)
Summary: [facility] FYI PT TO ED  Phone Note Call from Patient   Caller: Patient Summary of Call: pt c/o rapid heart beat, nausea, sweating, diarrhea, numbness in tongue and left arm, and pain in neck. Pt advised ED pt ok, will go to [facility] because [facility] is closer...................Marland KitchenFelecia Deloach CMA  March 04, 2010 12:02 PM     Follow-up for Phone Call        noted Follow-up by: Marga Melnick MD,  March 04, 2010 1:01 PM

## 2010-06-25 NOTE — Progress Notes (Signed)
Summary: lab order - dr hopper   Phone Note Call from Patient Call back at Home Phone [phone]   Caller: Patient Summary of Call: patient has pain groin area wants labs - need lab order - especially psa patient requested - lab scheduled [id number] --- Initial call taken by: Okey Regal [place],  March 29, 2007 3:20 PM  Follow-up for Phone Call        fasting lipids, hepatic panel, BMET, CBC & dif, PSA, dip urine (272.4,995.20,789.09, 600.9) Follow-up by: Marga Melnick MD,  March 29, 2007 5:43 PM         Appended Document: lab order - dr hopper Okey Regal please give him appt 5-7 days post labs. [employer]

## 2010-06-27 NOTE — Miscellaneous (Signed)
Summary: labs from [facility]   Clinical Lists Changes  Observations: Added new observation of TRIGLYC TOT: 108 mg/dL (44/05/270 53:66) Added new observation of LDL: 109 mg/dL (44/07/4740 59:56) Added new observation of HDL: 59 mg/dL (38/75/6433 29:51) Added new observation of CHOLESTEROL: 190 mg/dL (88/41/6606 30:16) Added new observation of CREATININE: 0.78 mg/dL (06/03/3233 57:32) Added new observation of POTASSIUM: 3.4 mmol/L (03/04/2010 14:02) Added new observation of SODIUM: 136 mmol/L (03/04/2010 14:02) Added new observation of PLATELETS: 183 10*3/mm3 (03/04/2010 14:02) Added new observation of HGB: 14.0 g/dL (20/25/4270 62:37) Added new observation of WBC: 11.4 10*3/mm3 (03/04/2010 14:02)

## 2010-06-27 NOTE — Assessment & Plan Note (Signed)
Summary: Chest congestion/sinus pressure/drb   Vital Signs:  Patient profile:   50 year old male Weight:      215.38 pounds Pulse rate:   82 / minute Pulse rhythm:   regular BP sitting:   124 / 88  (left arm) Cuff size:   regular  Vitals Entered By: Army Fossa CMA (May 23, 2010 10:24 AM) CC: Pt here c/o chest congestion, nasal congestion Comments mucus is green  x 10 days [facility] [address]    History of Present Illness: 10 days h/o chest and nose congestion unable to take most OTCs due to his aFib + cough w/ clear and now green d/c , thinks from PN drip no sputum per se   Current Medications (verified): 1)  Pradaxa 150 Mg Caps (Dabigatran Etexilate Mesylate) .... One Tablet By Mouth Two Times A Day 2)  Flecainide Acetate 50 Mg Tabs (Flecainide Acetate) .... One Tablet By Mouth Two Times A Day 3)  Diltiazem Hcl Cr 240 Mg Xr24h-Cap (Diltiazem Hcl) .... One Tablet By Mouth Once Daily 4)  Multivitamins   Tabs (Multiple Vitamin) .... One Tablet By Mouth Once Daily 5)  Salmon Oil .... As Directed 6)  Co Q 10 .... As Directed  Allergies (verified): 1)  ! Codeine  Past History:  Past Medical History: Reviewed history from 04/29/2010 and no changes required. 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 Tonsillectomy Otic Tubes bilaterally for recurrent otitis Colonoscopy  hyperplastic polyps 2002; 2005 : negative ; Endo 2005; HH,ERD ,chronic gastritis, esophageal stricture, duodenitis; Dilation X 1 in  2005  Social History: Reviewed history from 04/29/2010 and no changes required. 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 -2 cigarettes daily Daily Caffeine Use-1  Review of Systems General:  Denies chills and fever. GI:  Denies nausea and vomiting. MS:  some myalgias.  Physical Exam  General:  alert and well-developed.   Head:  face symetric , NTTP Ears:  R ear normal and L ear normal.   Nose:  congested  Mouth:  no red  Lungs:  normal respiratory effort, no intercostal retractions, no accessory muscle use, and normal breath sounds.   Heart:  normal rate, regular rhythm, and no murmur.     Impression & Recommendations:  Problem # 1:  URI (ICD-465.9) see instructions   Complete Medication List: 1)  Pradaxa 150 Mg Caps (Dabigatran etexilate mesylate) .... One tablet by mouth two times a day 2)  Flecainide Acetate 50 Mg Tabs (Flecainide acetate) .... One tablet by mouth two times a day 3)  Diltiazem Hcl Cr 240 Mg Xr24h-cap (Diltiazem hcl) .... One tablet by mouth once daily 4)  Multivitamins Tabs (Multiple vitamin) .... One tablet by mouth once daily 5)  Salmon Oil  .... As directed 6)  Co Q 10  .... As directed 7)  Amoxicillin 500 Mg Caps (Amoxicillin) .... 2 by mouth two times a day  Patient Instructions: 1)  fluids, tylenol 2)  mucinex   twice a day as needed  3)  Omnaris 2 sprays on each side of the nose once daily until samples gone  4)  amoxicillin 5)  call if no better in few days  Prescriptions: AMOXICILLIN 500  MG CAPS (AMOXICILLIN) 2 by mouth two times a day  #28 x 0   Entered and Authorized by:   Nolon Rod. Demica Zook MD   Signed by:   Nolon Rod. Jayliani Wanner MD on 05/23/2010   Method used:   Electronically to        [facility] [phone]* (retail)       [address]       [place], [place]  [address]       Ph: [phone]       Fax: [phone]   RxID:   [id number]    Orders Added: 1)  Est. Patient Level III [74259]

## 2010-07-01 ENCOUNTER — Encounter: Payer: Self-pay | Admitting: Internal Medicine

## 2010-07-16 ENCOUNTER — Encounter: Payer: Self-pay | Admitting: Internal Medicine

## 2010-07-17 NOTE — Letter (Signed)
Summary: d/c pradaxa, stay on [facility]---[facility]  [facility]   Imported By: Maryln Gottron 07/01/2010 15:03:28  _____________________________________________________________________  External Attachment:    Type:   Image     Comment:   External Document

## 2010-07-17 NOTE — Letter (Signed)
Summary: [facility]   Imported By: Maryln Gottron 07/11/2010 10:09:41  _____________________________________________________________________  External Attachment:    Type:   Image     Comment:   External Document

## 2010-07-18 ENCOUNTER — Encounter: Payer: Self-pay | Admitting: Internal Medicine

## 2010-08-06 NOTE — Procedures (Signed)
Summary: Holter Monitor-[facility]  Holter Monitor-[facility]   Imported By: Maryln Gottron 07/30/2010 14:53:57  _____________________________________________________________________  External Attachment:    Type:   Image     Comment:   External Document

## 2010-08-06 NOTE — Letter (Signed)
Summary: Rx holter, TSH----[facility]   Imported By: Lanelle Bal 07/23/2010 13:46:33  _____________________________________________________________________  External Attachment:    Type:   Image     Comment:   External Document

## 2010-08-06 NOTE — Procedures (Signed)
Summary: Holter Monitor Report/[place] [facility] [facility]  Holter Monitor Report/[facility]   Imported By: Maryln Gottron 08/02/2010 12:34:10  _____________________________________________________________________  External Attachment:    Type:   Image     Comment:   External Document

## 2010-10-08 NOTE — Assessment & Plan Note (Signed)
[facility]                         GASTROENTEROLOGY OFFICE NOTE   [name], [name]                         MRN:          [id number]  DATE:10/26/2006                            DOB:          April 24, 1961    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  He has been in fairly good health all of his life except  for some chronic acid reflux with a peptic stricture of his esophagus,  which was dilated in December, 2005.  He also at that time had some  rectal bleeding and underwent colonoscopy, which was unremarkable.  He  has been on Nexium therapy daily since that time and has been doing  well.  He recently saw Dr. Alwyn Ren and he again complained of some  intermittent solid food dysphagia and occasional constipation with  occasional hemorrhoidal bleeding.  He has had no anorexia, weight loss,  or specific cardiopulmonary or pulmonary complaints.  He gives no known  history of liver disease but has had mildly abnormal liver function  tests for the last several years with his enzymes approximately twice-  normal.  He drinks approximately three alcoholic drinks a day.  He  denies illicit drug use or previous hepatitis.   FAMILY HISTORY:  Negative for any known liver disease except for  alcoholism in several uncles and his grandfather.   He denies chronic fatigue, recurrent skin rashes, joint pain, oral  stomatitis, etc.  He has recently changed his diet, is eating more  fruits and vegetables and seems to feel better overall.   In addition to Nexium, he currently takes multivitamins, saw palmetto,  milk thistle, vitamin B, fish oil and vitamin C.   He denies drug allergies.   PAST MEDICAL HISTORY:  Otherwise noncontributory except for  hypercholesterolemia and allergic sinusitis.  He had stress Cardiolite  testing in October, 2006, which showed some mild abnormality from the  posterior wall, suggestive of possible old  injury.  On reviewing his old  records, the patient did have colonoscopy additionally in May, 2002 by  Dr. Roanna Raider, which was unremarkable except for some hemorrhoids.  This apparently was done in [facility].  Previous CLO testing in  December, 2005 was negative for H. Pylori.   FAMILY HISTORY:  As per above.  Remarkable for atherogenesis in his  father and uncles, prostate cancer in his father, multiple members with  diabetes and alcoholism.   Patient is single and lives by himself.  He has a Naval architect.  He  smokes one pack of cigarettes per day and uses ethanol moderately but  denies problems with alcohol abuse or dependency.   REVIEW OF SYSTEMS:  Otherwise noncontributory.   PHYSICAL EXAMINATION:  GENERAL:  He is a healthy-appearing white male in  no distress, appearing his stated age.  I cannot appreciate stigmata of  chronic liver disease.  He is 6 feet 2 inches tall, weighs 192 pounds.  VITAL SIGNS:  The blood pressure is 108/72.  Pulse 68 and regular.  CHEST:  Clear.  HEART:  I could not  appreciate murmurs, rubs or gallops.  He appeared to  be in a regular rhythm.  ABDOMEN:  His liver was somewhat enlarge with a very firm edge on deep  inspiration of the right upper quadrant but no definite nodularity.  He  could not appreciate splenomegaly, other abdominal masses or tenderness.  EXTREMITIES:  Peripheral extremities were unremarkable.  MENTAL STATUS:  Clear.  RECTAL:  Deferred.   ASSESSMENT:  1. Probable Schatzki's ring in his distal esophagus versus recurrent      peptic stricture with associated dysphagia.  2. Chronic acid reflux, doing well on daily Nexium therapy.  3. Abnormal liver function tests, rule out chronic metabolic liver      disease versus chronic alcohol injury.  4. History of intermittent hemorrhoidal bleeding.  5. Family history of prostate cancer in his father.  6. A strong family history of alcoholism and diabetes.    RECOMMENDATIONS:  1. Repeat endoscopy with empiric dilation.  2. Repeat upper abdominal ultrasound exam and will check metabolic and      viral liver panels.  3. Continue other medications, as per Dr. Alwyn Ren.     Vania Rea. Jarold Motto, MD, Caleen Essex, FAGA  Electronically Signed    DRP/MedQ  DD: 10/26/2006  DT: 10/27/2006  Job #: [id number]   cc:   Titus Dubin. Alwyn Ren, MD,FACP,FCCP

## 2010-10-10 IMAGING — CR DG CHEST 2V
1 series · 1 of 1 positions shown · non-contrast
Comparison: 02/24/2005

CLINICAL DATA: 47-year-old male hypertension, chest pain,

CHEST - 2 VIEW

[view not recorded]
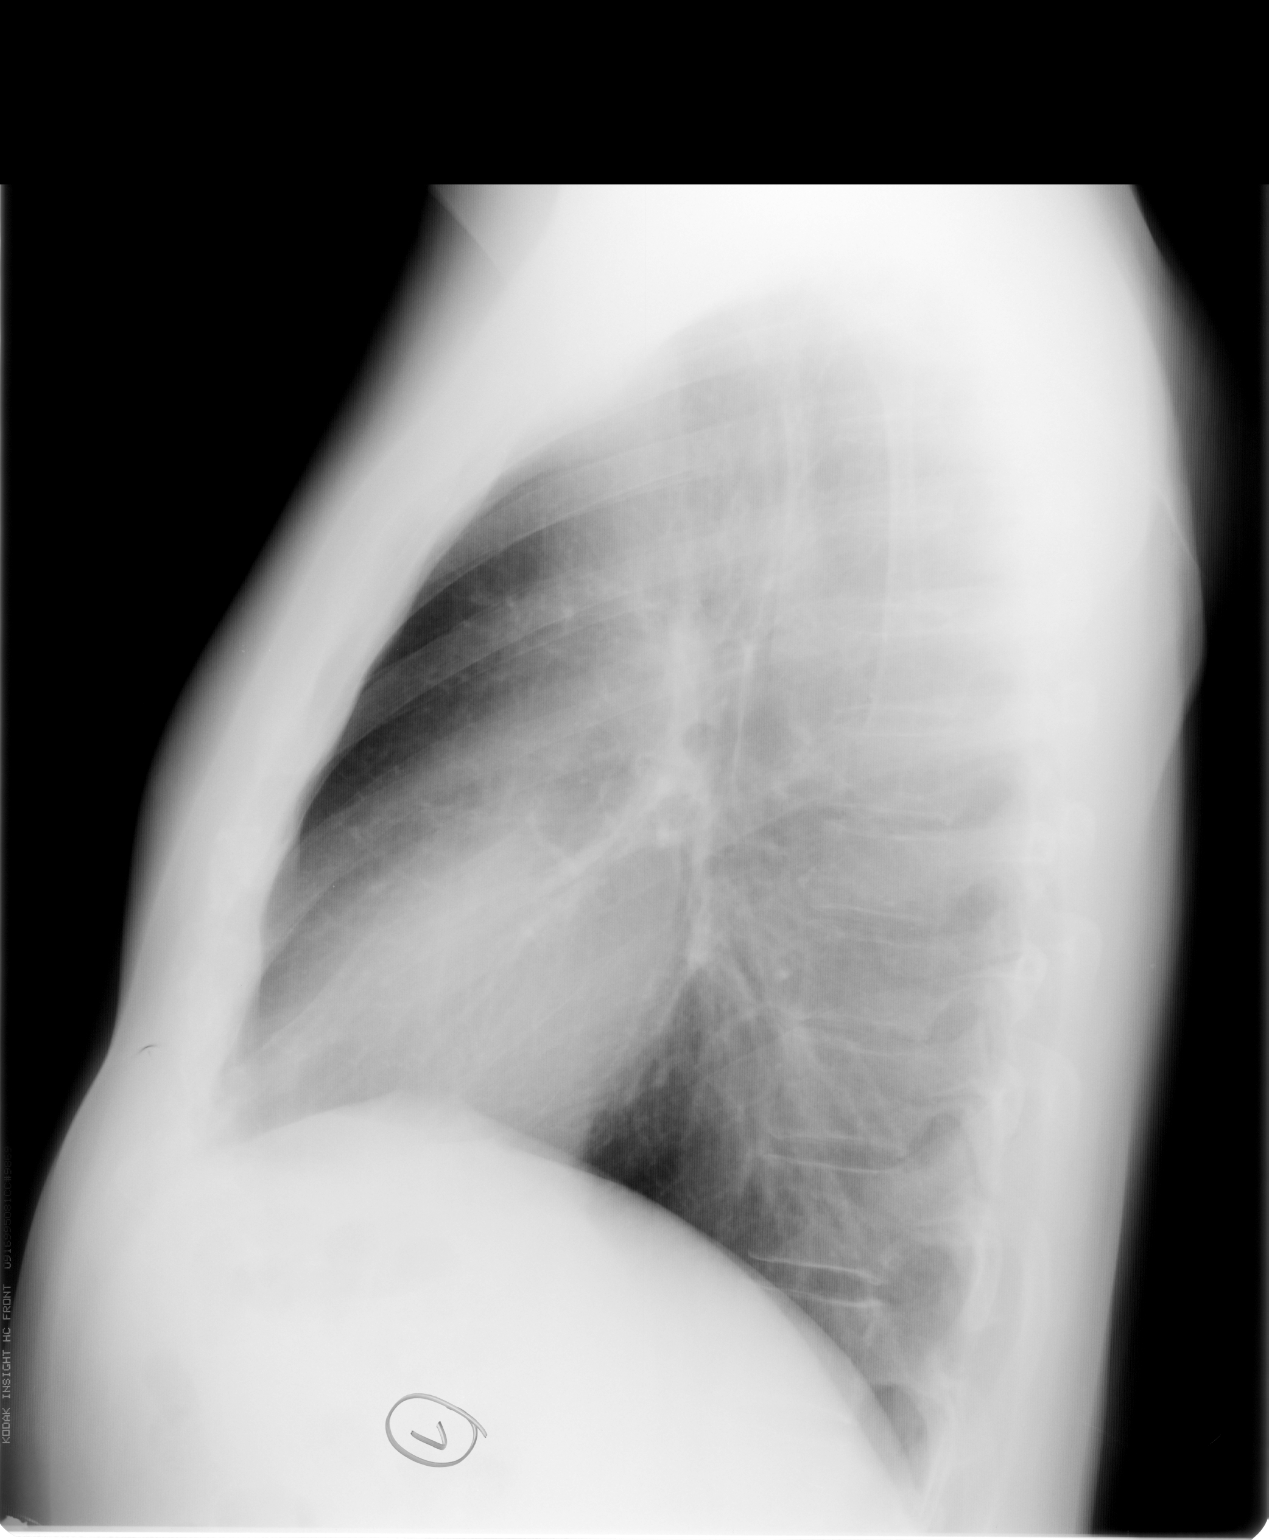

[1 of 1 positions shown; findings below may reference images not displayed]

FINDINGS: Stable mild hyperinflation.  Normal heart size and
vascularity.  Negative for pneumonia, edema, effusion or
pneumothorax.  Minimal apical thickening and small subpleural
lucencies suspicious for apical blebs.  Trachea is midline. Stable
left upper lobe scarring. Overall stable exam.
IMPRESSION: Stable chest exam as described.  No interval change or acute
process

## 2010-10-11 NOTE — Letter (Signed)
Sep 28, 2006    Vania Rea. Jarold Motto, MD, FACG, FACP, FAGA  [address]  [place], [place] [address]   RE:  Samuel Golden, Samuel Golden  MRN:  [id number]  /  DOB:  [birth year]   Dear [name]:   I am concerned because Samuel Golden has been having symptom which are  suggestive of recurrent esophageal stricture.  Over the last six months  he has had at least six episodes of dysphagia.  He has also had melena  or rectal bleeding three or four times a month.   Risk factors include a half a pack per day of smoking; ingestion of  Altoids; 2 cups of coffee; and 3-4 alcoholic beverages a day.  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   In December 2005, you found reflux esophagitis and esophageal stricture  as well as gastritis and duodenitis.  He is on ranitidine 150 mg twice a  day with suboptimal control.   He has cut his aspirin from 325 mg a day to 81 mg daily.   He remains on a very rich but basically heart healthy diet  @ the  [employer].  He is also on Lipitor 20 mg three days a week;  followup lipids are pending.  He has had no coke-colored urine or white  stool at all.  He also has been walking and denies any angina, although  he does have some exertional dyspnea.   PHYSICAL EXAMINATION:  VITAL SIGNS:  On exam, his weight is down 5  pounds to 193.4.  Pulse is 60 and regular.  Respiratory rate is 14, and  blood pressure is 120/78.  GENERAL:  He has no icterus or jaundice.  HEENT:  There is some erythema in the posterior pharynx.  CHEST:  Clear.  HEART:  He has splitting of the first heart sound with an increased  second heart sound at the apex.  ABDOMEN:  Nontender without organomegaly or masses.  EXTREMITIES:  All pulses are intact.  There is no edema.   Pending are lipids and hepatic profile.  I will ask him  to complete  stool cards.   Would you please have your office schedule a followup visit; Mondays  would be the best day for him due to work  demands.  Pending that visit,  we will give him samples of Nexium to be taken before breakfast and  before the evening meal.  I have also reinforced the triggers for reflux  which would be the aspirin family, alcohol, peppermint, tobacco,  caffeine.  He should also avoid all food intake within 2-3 hours of  going to bed.    Sincerely,      Titus Dubin. Alwyn Ren, MD,FACP,FCCP  Electronically Signed    [facility]/MedQ  DD: 09/28/2006  DT: 09/28/2006  Job #: [id number]

## 2010-11-07 ENCOUNTER — Encounter: Payer: Self-pay | Admitting: Internal Medicine

## 2010-11-11 ENCOUNTER — Ambulatory Visit (INDEPENDENT_AMBULATORY_CARE_PROVIDER_SITE_OTHER): Payer: BC Managed Care – PPO | Admitting: Internal Medicine

## 2010-11-11 ENCOUNTER — Encounter: Payer: Self-pay | Admitting: Internal Medicine

## 2010-11-11 VITALS — BP 116/84 | HR 82 | Wt 216.6 lb

## 2010-11-11 DIAGNOSIS — E782 Mixed hyperlipidemia: Secondary | ICD-10-CM

## 2010-11-11 DIAGNOSIS — I4891 Unspecified atrial fibrillation: Secondary | ICD-10-CM

## 2010-11-11 DIAGNOSIS — R7309 Other abnormal glucose: Secondary | ICD-10-CM

## 2010-11-11 DIAGNOSIS — R739 Hyperglycemia, unspecified: Secondary | ICD-10-CM | POA: Insufficient documentation

## 2010-11-11 LAB — ALT: ALT: 46 U/L (ref 0–53)

## 2010-11-11 LAB — LIPID PANEL
Cholesterol: 253 mg/dL — ABNORMAL HIGH (ref 0–200)
Total CHOL/HDL Ratio: 4
VLDL: 17.4 mg/dL (ref 0.0–40.0)

## 2010-11-11 LAB — AST: AST: 33 U/L (ref 0–37)

## 2010-11-11 NOTE — Patient Instructions (Signed)
Call your cardiologist this afternoon

## 2010-11-11 NOTE — Assessment & Plan Note (Signed)
Lab

## 2010-11-11 NOTE — Assessment & Plan Note (Addendum)
paroxismal atrial fibrillation, last episode this morning, EKG today shows sinus rhythm without acute changes. The patient wonders if he should take flecainide daily. Plan: Fax EKG to cardiology, recommend patient to contact cardiology for further advice.

## 2010-11-11 NOTE — Progress Notes (Signed)
  Subjective:    Patient ID: Samuel Golden, male    DOB: 1960/11/27, 50 y.o.   MRN: [id number]  HPI The main reason he is here today is because his blood sugar was a slightly elevated last week at cardiology. Additionally, he had episode of atrial fibrillation that started at 4 AM this morning and ended up around 8:30 AM. He took flecainide x3 and 2 aspirins. Currently does feel a little fatigued.  Past Medical History  Diagnosis Date  . Atrial fib/flutter, transient 10/11    quickly wnt on NSR  . Duodenitis     gastritis, chronic , hiatal hernia, esophagitis, GERD  . Allergic rhinitis     seasonal  . BPH (benign prostatic hypertrophy)   . Hyperplastic colonic polyp   . Hyperlipidemia     Past Surgical History  Procedure Date  . Tonsillectomy   . Tympanostomy tube placement     for recurrent otitis  . Duodenitits     dilation x 1 in 2005       Review of Systems Denies excessive appetite, he drinks a lot of water but does not unusual for him. His weight has increased lately. No chest pain or shortness of breath. Would like additional labs  including his cholesterol and LFTs.    Objective:   Physical Exam  Constitutional: He is oriented to person, place, and time. He appears well-developed and well-nourished. No distress.  Cardiovascular: Normal rate, regular rhythm and normal heart sounds.   No murmur heard. Pulmonary/Chest: Effort normal and breath sounds normal. No respiratory distress. He has no wheezes. He has no rales.  Abdominal: Soft. Bowel sounds are normal. He exhibits no distension. There is no tenderness. There is no rebound.  Musculoskeletal: He exhibits no edema.  Neurological: He is alert and oriented to person, place, and time.  Skin: He is not diaphoretic.          Assessment & Plan:

## 2010-11-12 NOTE — Assessment & Plan Note (Addendum)
Labs , found to have a slightly elevated blood sugar at cardiology

## 2010-11-13 ENCOUNTER — Telehealth: Payer: Self-pay | Admitting: *Deleted

## 2010-11-13 DIAGNOSIS — E785 Hyperlipidemia, unspecified: Secondary | ICD-10-CM

## 2010-11-13 NOTE — Telephone Encounter (Signed)
Faxed to cards.

## 2010-11-13 NOTE — Telephone Encounter (Signed)
Message copied by Leanne Lovely on Wed Nov 13, 2010  9:07 AM ------      Message from: Willow Ora E      Created: Wed Nov 13, 2010  8:09 AM       Advise patient in      Diabetes test is negative., Recheck blood sugars from time to time.      LFTs normal.      Cholesterol is moderately elevated. Nodules are      1. Diet exercise, nutritionist referral.      2. Same plus medication, in the past he took Lipitor we could  restart it.      Recheck cholesterol in 4 months

## 2010-11-13 NOTE — Telephone Encounter (Signed)
Message copied by Leanne Lovely on Wed Nov 13, 2010  9:30 AM ------      Message from: Willow Ora E      Created: Tue Nov 12, 2010  5:32 PM       Fax my note from 11-11-10 to cards (and the EKG if not done yesterday)

## 2010-11-13 NOTE — Telephone Encounter (Signed)
Message left for patient to return my call.  

## 2010-11-13 NOTE — Telephone Encounter (Signed)
Spoke w/ pt aware of instructions 

## 2011-02-13 ENCOUNTER — Telehealth: Payer: Self-pay | Admitting: Internal Medicine

## 2011-02-13 ENCOUNTER — Other Ambulatory Visit: Payer: BC Managed Care – PPO

## 2011-02-13 DIAGNOSIS — Z5181 Encounter for therapeutic drug level monitoring: Secondary | ICD-10-CM

## 2011-02-13 NOTE — Telephone Encounter (Signed)
Patient states Dr.Paz is his primary , Dr.Paz please advise if patient can have labs added (pending appointment to have cholesterol checked on Monday)

## 2011-02-13 NOTE — Progress Notes (Signed)
Labs only

## 2011-02-14 NOTE — Telephone Encounter (Signed)
PSA was normal 04/2010, I recommend he to come  back for a CPX 04/2011, will recheck then  Okay to check liver enzymes, DX monitoring drug therapy

## 2011-02-14 NOTE — Telephone Encounter (Signed)
Left message on machine for patient  And lab ordered

## 2011-02-17 ENCOUNTER — Other Ambulatory Visit (INDEPENDENT_AMBULATORY_CARE_PROVIDER_SITE_OTHER): Payer: BC Managed Care – PPO

## 2011-02-17 DIAGNOSIS — Z5181 Encounter for therapeutic drug level monitoring: Secondary | ICD-10-CM

## 2011-02-17 NOTE — Progress Notes (Signed)
Labs only

## 2011-02-24 ENCOUNTER — Other Ambulatory Visit: Payer: Self-pay | Admitting: *Deleted

## 2011-02-24 ENCOUNTER — Telehealth: Payer: Self-pay | Admitting: *Deleted

## 2011-02-24 DIAGNOSIS — Z5181 Encounter for therapeutic drug level monitoring: Secondary | ICD-10-CM

## 2011-02-24 NOTE — Telephone Encounter (Signed)
Spoke w/patient who is extremely upset and voiced concerns that "he is tired of the errors made by this office over the years"; reiterated apology for inconvenience. Scheduled for redraw tomorrow morning @ 8:45am. Patient said he "expects to be accommodated when he arrives without any wait". Forward information to OM/Nikki.

## 2011-02-24 NOTE — Telephone Encounter (Signed)
Labs Were Drawn ON 02/17/11, But No Handling Shown Afterwards In System. Called patient/LMOM, Apologized and informed of Office Error, requested call back to schedule to have labs redrawn at his convenience [time & office w/o venipuncture charge].

## 2011-02-24 NOTE — Telephone Encounter (Signed)
Request for lab results done 02/17/11. Given to [place] to research.

## 2011-02-25 ENCOUNTER — Other Ambulatory Visit: Payer: Self-pay | Admitting: *Deleted

## 2011-02-25 ENCOUNTER — Other Ambulatory Visit (INDEPENDENT_AMBULATORY_CARE_PROVIDER_SITE_OTHER): Payer: BC Managed Care – PPO

## 2011-02-25 DIAGNOSIS — E785 Hyperlipidemia, unspecified: Secondary | ICD-10-CM

## 2011-02-25 DIAGNOSIS — Z5181 Encounter for therapeutic drug level monitoring: Secondary | ICD-10-CM

## 2011-02-25 LAB — LIPID PANEL: Triglycerides: 177 mg/dL — ABNORMAL HIGH (ref 0.0–149.0)

## 2011-02-25 LAB — HEPATIC FUNCTION PANEL
Albumin: 4 g/dL (ref 3.5–5.2)
Alkaline Phosphatase: 80 U/L (ref 39–117)
Total Protein: 6.7 g/dL (ref 6.0–8.3)

## 2011-02-25 LAB — LDL CHOLESTEROL, DIRECT: Direct LDL: 137.4 mg/dL

## 2011-02-25 LAB — PSA: PSA: 0.78 ng/mL (ref 0.10–4.00)

## 2011-02-25 NOTE — Progress Notes (Signed)
Labs only

## 2011-03-17 ENCOUNTER — Telehealth: Payer: Self-pay | Admitting: Gastroenterology

## 2011-03-17 NOTE — Telephone Encounter (Signed)
lmom for pt to call back

## 2011-03-18 NOTE — Telephone Encounter (Signed)
Pt reports he's getting mucus up in his throat and it was worse over the weekend. He has a Hiatal Hernia and he doesn't know it that's acting up or if he needs another EGD. He states he does feel better today, so can wait until next week to be seen. Explained to pt Dr Jarold Motto has no appts until 04/01/11 and he stated he will see a mid level next week. Explained the schedule does not come out until Friday or Monday and I will call him; pt stated understanding; offered that pt may want to try Mucinex- it could be sinus drainage.

## 2011-03-21 NOTE — Telephone Encounter (Signed)
lmom for pt to call back

## 2011-03-25 NOTE — Telephone Encounter (Signed)
Pt never called back.

## 2011-03-25 NOTE — Telephone Encounter (Signed)
lmom for pt to call back if he wants an appt.

## 2011-05-05 ENCOUNTER — Other Ambulatory Visit: Payer: Self-pay | Admitting: Internal Medicine

## 2011-05-05 ENCOUNTER — Ambulatory Visit (INDEPENDENT_AMBULATORY_CARE_PROVIDER_SITE_OTHER): Payer: BC Managed Care – PPO | Admitting: Internal Medicine

## 2011-05-05 ENCOUNTER — Encounter: Payer: Self-pay | Admitting: Internal Medicine

## 2011-05-05 DIAGNOSIS — Z0000 Encounter for general adult medical examination without abnormal findings: Secondary | ICD-10-CM

## 2011-05-05 DIAGNOSIS — T887XXA Unspecified adverse effect of drug or medicament, initial encounter: Secondary | ICD-10-CM

## 2011-05-05 DIAGNOSIS — Z8249 Family history of ischemic heart disease and other diseases of the circulatory system: Secondary | ICD-10-CM

## 2011-05-05 DIAGNOSIS — J301 Allergic rhinitis due to pollen: Secondary | ICD-10-CM

## 2011-05-05 NOTE — Assessment & Plan Note (Signed)
Recommend to avoid pseudoephedrine, okay to use Benadryl, Zyrtec, Mucinex DM

## 2011-05-05 NOTE — Assessment & Plan Note (Addendum)
Td ~ 2006 Declined a flu shot, explained the benefits  Colonoscopy per GI Tobacco -- counseled All  labs reviewed, see instructions  diet and exercise discussed

## 2011-05-05 NOTE — Progress Notes (Signed)
  Subjective:    Patient ID: Samuel Golden, male    DOB: 09-06-1960, 50 y.o.   MRN: [id number]  HPI CPX  Past Medical History: Transient Atrial Fibrilation, dx 10-11, quikly wnt on NSR (see cards @ [facility] Dr Jeralene Huff)  DUODENITIS, GASTRITIS, CHRONIC , HIATAL HERNIA, ESOPHAGITIS, GERD,  COLONIC POLYPS, HYPERPLASTIC------> GI Dr Jarold Motto ALLERGIC RHINITIS, SEASONAL   BPH  HYPERLIPIDEMIA    Past Surgical History: Tonsillectomy Otic Tubes bilaterally for recurrent otitis Colonoscopy  hyperplastic polyps 2002; 2005 : negative Endo 2005; HH,ERD ,chronic gastritis, esophageal stricture, duodenitis; Dilation X 1 in  2005  Family History: Diabetes-- F M cousins  cholesterrol-- F M  Hypertension-- F M  CAD, many memembers of his family , Father: MI @ 53 prostate CA--F dx age mid 90s colon ca--no Colitis/Crohn's: Aunt x 2  Social History: 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 He spoke with his heart doctor and  it was left up to him if he should take daily vs PRN flecainide; he decide to take it daily. No chest pain or shortness of breath No nausea, vomiting, diarrhea or blood in the stools No dysuria or gross hematuria. No anxiety o depression     Objective:   Physical Exam  Constitutional: He is oriented to person, place, and time. He appears well-developed and well-nourished.  HENT:  Head: Normocephalic and atraumatic.  Right Ear: External ear normal.  Left Ear: External ear normal.  Neck: No thyromegaly present.       Normal carotid pulse  Cardiovascular: Normal rate, regular rhythm and normal heart sounds.   No murmur heard. Pulmonary/Chest: Effort normal and breath sounds normal. No respiratory distress. He has  no wheezes. He has no rales.  Abdominal: Soft. Bowel sounds are normal. He exhibits no distension. There is no tenderness. There is no rebound and no guarding.  Genitourinary: Rectum normal and prostate normal.  Musculoskeletal: He exhibits no edema.  Neurological: He is alert and oriented to person, place, and time.  Psychiatric: He has a normal mood and affect. His behavior is normal. Judgment and thought content normal.       Assessment & Plan:

## 2011-05-05 NOTE — Patient Instructions (Addendum)
ase came back fasting: BMP-FLP-CBC- HIV ------> dx V70

## 2011-05-06 ENCOUNTER — Other Ambulatory Visit (INDEPENDENT_AMBULATORY_CARE_PROVIDER_SITE_OTHER): Payer: BC Managed Care – PPO

## 2011-05-06 DIAGNOSIS — Z0000 Encounter for general adult medical examination without abnormal findings: Secondary | ICD-10-CM

## 2011-05-06 DIAGNOSIS — T887XXA Unspecified adverse effect of drug or medicament, initial encounter: Secondary | ICD-10-CM

## 2011-05-06 DIAGNOSIS — Z8249 Family history of ischemic heart disease and other diseases of the circulatory system: Secondary | ICD-10-CM

## 2011-05-06 LAB — CBC WITH DIFFERENTIAL/PLATELET
Basophils Absolute: 0 10*3/uL (ref 0.0–0.1)
Basophils Relative: 0.6 % (ref 0.0–3.0)
Eosinophils Absolute: 0.1 10*3/uL (ref 0.0–0.7)
Eosinophils Relative: 1 % (ref 0.0–5.0)
HCT: 44.8 % (ref 39.0–52.0)
Hemoglobin: 15.2 g/dL (ref 13.0–17.0)
Lymphocytes Relative: 28 % (ref 12.0–46.0)
Lymphs Abs: 2.3 10*3/uL (ref 0.7–4.0)
MCHC: 34 g/dL (ref 30.0–36.0)
MCV: 101.4 fl — ABNORMAL HIGH (ref 78.0–100.0)
Monocytes Absolute: 0.8 10*3/uL (ref 0.1–1.0)
Monocytes Relative: 9.2 % (ref 3.0–12.0)
Neutro Abs: 5 10*3/uL (ref 1.4–7.7)
Neutrophils Relative %: 61.2 % (ref 43.0–77.0)
Platelets: 189 10*3/uL (ref 150.0–400.0)
RBC: 4.42 Mil/uL (ref 4.22–5.81)
RDW: 13.4 % (ref 11.5–14.6)
WBC: 8.1 10*3/uL (ref 4.5–10.5)

## 2011-05-06 LAB — BASIC METABOLIC PANEL
CO2: 29 mEq/L (ref 19–32)
Chloride: 105 mEq/L (ref 96–112)
Creatinine, Ser: 0.8 mg/dL (ref 0.4–1.5)

## 2011-05-09 LAB — LIPID PANEL
Cholesterol: 223 mg/dL — ABNORMAL HIGH (ref 0–200)
HDL: 49.8 mg/dL (ref >39.00)
Triglycerides: 199 mg/dL — ABNORMAL HIGH (ref 0.0–149.0)
VLDL: 39.8 mg/dL (ref 0.0–40.0)

## 2011-05-12 ENCOUNTER — Encounter (INDEPENDENT_AMBULATORY_CARE_PROVIDER_SITE_OTHER): Payer: BC Managed Care – PPO | Admitting: *Deleted

## 2011-05-12 ENCOUNTER — Encounter: Payer: BC Managed Care – PPO | Admitting: Cardiology

## 2011-05-12 ENCOUNTER — Ambulatory Visit (INDEPENDENT_AMBULATORY_CARE_PROVIDER_SITE_OTHER): Payer: BC Managed Care – PPO | Admitting: Internal Medicine

## 2011-05-12 ENCOUNTER — Other Ambulatory Visit: Payer: Self-pay | Admitting: *Deleted

## 2011-05-12 VITALS — BP 118/76 | HR 65 | Temp 98.7°F | Ht 74.5 in | Wt 217.0 lb

## 2011-05-12 DIAGNOSIS — IMO0002 Reserved for concepts with insufficient information to code with codable children: Secondary | ICD-10-CM | POA: Insufficient documentation

## 2011-05-12 DIAGNOSIS — M79609 Pain in unspecified limb: Secondary | ICD-10-CM

## 2011-05-12 DIAGNOSIS — E782 Mixed hyperlipidemia: Secondary | ICD-10-CM

## 2011-05-12 DIAGNOSIS — R229 Localized swelling, mass and lump, unspecified: Secondary | ICD-10-CM

## 2011-05-12 NOTE — Assessment & Plan Note (Addendum)
Suspected calf (gastrocnemius) injury, less likely clot. To be sure we'll refer  for a Doppler ultrasound. Later on he can see orthopedic surgery. We also recommend elevation of the leg, ice today and heat starting tomorrow. Motrin as needed. Addendum: u/s neg for DVT, asked u/s tech to make  pt aware

## 2011-05-12 NOTE — Progress Notes (Signed)
  Subjective:    Patient ID: Samuel Golden, male    DOB: 06-07-1960, 50 y.o.   MRN: [id number]  HPI Yesterday, he was running to his car because it was raining and he had a sudden onset of a severe left calf pain and he felt a pop. The area is sore and slightly swollen since yesterday, the pain is worse with certain movements. Patient is afraid of a clott  Past Medical History:  Transient Atrial Fibrilation, dx 10-11, quikly wnt on NSR (see cards @ [facility] Dr Jeralene Huff)  DUODENITIS, GASTRITIS, CHRONIC , HIATAL HERNIA, ESOPHAGITIS, GERD, COLONIC POLYPS, HYPERPLASTIC------> GI Dr Jarold Motto  ALLERGIC RHINITIS, SEASONAL  BPH  HYPERLIPIDEMIA   Past Surgical History:  Tonsillectomy  Otic Tubes bilaterally for recurrent otitis  Colonoscopy hyperplastic polyps 2002; 2005 : negative  Endo 2005; HH,ERD ,chronic gastritis, esophageal stricture, duodenitis; Dilation X 1 in 2005   Family History:  Diabetes-- F M cousins  cholesterrol-- F M  Hypertension-- F M  CAD, many memembers of his family , Father: MI @ 49  prostate CA--F dx age mid 16s  colon ca--no  Colitis/Crohn's: Aunt x 2   Social History:  Occupation: Waiter @[employer]  Single, no children  Alcohol use-yes-1-2 glasses wine/daily  exercise-- no routine exercise but active at work  smokes--2 cigarettes daily  Daily Caffeine Use-1   Review of Systems No other injuries Would like to discuss his labs   Objective:   Physical Exam  Constitutional: He is oriented to person, place, and time. He appears well-developed and well-nourished. No distress.  Cardiovascular:       Normal pedal pulses bilaterally, no lower extremity edema He has varicose veins in both extremities, slightly more visible on the left  Musculoskeletal:       Right calf normal. Left calf is half inch larger, tender at the gastrocnemius area, proximally. Achilles tendon normal  Neurological: He is alert and oriented to person, place, and time.  Skin: He is not  diaphoretic.  Psychiatric: He has a normal mood and affect. His behavior is normal. Thought content normal.      Assessment & Plan:

## 2011-05-12 NOTE — Patient Instructions (Signed)
Please schedule a FLP for mid  February, dx hyperlipidemia You don't need to see me

## 2011-05-12 NOTE — Assessment & Plan Note (Addendum)
Recent lab results reviewed, LDL 139, triglycerides are 199. He has a family history of heart disease. I recommend a goal of LDL lower than 130 and as close as 100 as possible. We discussed medication, he would like to try lifestyle modifications again, we agreed that he will come back in 10 weeks to be rechecked and if not at goal, he will then start medication

## 2011-07-14 ENCOUNTER — Encounter: Payer: Self-pay | Admitting: *Deleted

## 2011-07-15 ENCOUNTER — Other Ambulatory Visit (INDEPENDENT_AMBULATORY_CARE_PROVIDER_SITE_OTHER): Payer: BC Managed Care – PPO

## 2011-07-15 ENCOUNTER — Ambulatory Visit (INDEPENDENT_AMBULATORY_CARE_PROVIDER_SITE_OTHER): Payer: BC Managed Care – PPO | Admitting: Gastroenterology

## 2011-07-15 ENCOUNTER — Encounter: Payer: Self-pay | Admitting: Gastroenterology

## 2011-07-15 DIAGNOSIS — R1310 Dysphagia, unspecified: Secondary | ICD-10-CM

## 2011-07-15 DIAGNOSIS — Q393 Congenital stenosis and stricture of esophagus: Secondary | ICD-10-CM

## 2011-07-15 DIAGNOSIS — Q391 Atresia of esophagus with tracheo-esophageal fistula: Secondary | ICD-10-CM

## 2011-07-15 DIAGNOSIS — K5901 Slow transit constipation: Secondary | ICD-10-CM

## 2011-07-15 DIAGNOSIS — Q394 Esophageal web: Secondary | ICD-10-CM | POA: Insufficient documentation

## 2011-07-15 LAB — VITAMIN B12: Vitamin B-12: 231 pg/mL (ref 211–911)

## 2011-07-15 LAB — FOLATE: Folate: 7.1 ng/mL (ref >5.9)

## 2011-07-15 LAB — IBC PANEL: Saturation Ratios: 55.5 % — ABNORMAL HIGH (ref 20.0–50.0)

## 2011-07-15 NOTE — Patient Instructions (Signed)
Please go to the basement today Take Metamucil one teaspoon once a day.  Your procedure has been scheduled for 08/04/2011, please follow the seperate instructions.

## 2011-07-15 NOTE — Progress Notes (Signed)
This is a 51 year old Caucasian male with chronic acid reflux all daily Prilosec 20 mg a day. He now presents with intermittent solid food dysphagia, and has a previous history of a Schatzki's ring requiring dilation in 2008. He is up-to-date on his colonoscopy, but does have mild chronic constipation with gas and bloating. He denies melena, hematochezia, hepatobiliary or systemic complaints. He also denies abuse of NSAIDs or alcohol. His appetite is good his weight is stable. He has been recently diagnosed with atrial flutter ablation and has had chemical cardioversion. He is on aspirin 325 mg a day, and Tambocor 100 mg twice a day. He denies current cardiovascular or pulmonary complaints.  Current Medications, Allergies, Past Medical History, Past Surgical History, Family History and Social History were reviewed in [employer] record.  Pertinent Review of Systems Negative   Physical Exam: Healthy patient with blood pressure 112/62, BM 27.9. I cannot appreciate stigmata of chronic liver disease, thyromegaly or lymphadenopathy. Examination of the oropharynx is unremarkable. Chest is clear to P&A, and he appears to be a regular rhythm without murmurs gallops or rubs. There is no hepatosplenomegaly, abdominal masses or tenderness. Bowel sounds are normal. Peripheral extremities are unremarkable, and mental status is normal.    Assessment and Plan: Current dysphagia related to Schatzki's ring his distal esophagus. He has no symptoms of an esophageal motility disorder. I have scheduled repeat endoscopy with [facility] dilation. Otherwise I have advised that he continues medications as listed and reviewed. For his constipation I have advised daily Metamucil with liberal by mouth fluids. Encounter Diagnosis  Name Primary?  Marland Kitchen Dysphagia Yes

## 2011-07-17 ENCOUNTER — Other Ambulatory Visit: Payer: Self-pay | Admitting: Gastroenterology

## 2011-07-17 DIAGNOSIS — E538 Deficiency of other specified B group vitamins: Secondary | ICD-10-CM

## 2011-07-17 MED ORDER — CYANOCOBALAMIN 1000 MCG/ML IJ SOLN
1000.0000 ug | INTRAMUSCULAR | Status: AC
  Administered 2011-07-21 – 2011-08-06 (×3): 1000 ug via INTRAMUSCULAR

## 2011-07-21 ENCOUNTER — Ambulatory Visit (INDEPENDENT_AMBULATORY_CARE_PROVIDER_SITE_OTHER): Payer: BC Managed Care – PPO | Admitting: Gastroenterology

## 2011-07-21 DIAGNOSIS — E538 Deficiency of other specified B group vitamins: Secondary | ICD-10-CM

## 2011-07-28 ENCOUNTER — Ambulatory Visit (INDEPENDENT_AMBULATORY_CARE_PROVIDER_SITE_OTHER): Payer: BC Managed Care – PPO | Admitting: Gastroenterology

## 2011-07-28 DIAGNOSIS — E538 Deficiency of other specified B group vitamins: Secondary | ICD-10-CM

## 2011-08-01 ENCOUNTER — Telehealth: Payer: Self-pay | Admitting: Internal Medicine

## 2011-08-01 NOTE — Telephone Encounter (Signed)
Left message to call office. To advise Pt that we have still not received info that he was going to fax over in reference to his labs.

## 2011-08-01 NOTE — Telephone Encounter (Signed)
Patient is confused about his labs from December. He stated that he received a letter from [facility] as well as [employer] regarding his diagnosis. Can you please call him regarding this matter?  Samuel Bible ph# [phone] He is going to fax the paperwork he received so I will bring it to you as soon as it comes in Thanks

## 2011-08-04 ENCOUNTER — Encounter: Payer: Self-pay | Admitting: Gastroenterology

## 2011-08-04 ENCOUNTER — Ambulatory Visit (AMBULATORY_SURGERY_CENTER): Payer: BC Managed Care – PPO | Admitting: Gastroenterology

## 2011-08-04 DIAGNOSIS — K219 Gastro-esophageal reflux disease without esophagitis: Secondary | ICD-10-CM

## 2011-08-04 DIAGNOSIS — R1310 Dysphagia, unspecified: Secondary | ICD-10-CM

## 2011-08-04 DIAGNOSIS — K222 Esophageal obstruction: Secondary | ICD-10-CM

## 2011-08-04 MED ORDER — SODIUM CHLORIDE 0.9 % IV SOLN: 500.0000 mL | INTRAVENOUS | Status: DC

## 2011-08-04 NOTE — Patient Instructions (Signed)
Resume your prior medications today. Please call if any questions or concerns. Handouts given on dilatation diet for the rest of the day.  Resume your prior diet Tuesday AM.  Handouts given also on GERD too.   YOU HAD AN ENDOSCOPIC PROCEDURE TODAY AT THE [facility]: Refer to the procedure report that was given to you for any specific questions about what was found during the examination.  If the procedure report does not answer your questions, please call your gastroenterologist to clarify.  If you requested that your care partner not be given the details of your procedure findings, then the procedure report has been included in a sealed envelope for you to review at your convenience later.  YOU SHOULD EXPECT: Some feelings of bloating in the abdomen. Passage of more gas than usual.  Walking can help get rid of the air that was put into your GI tract during the procedure and reduce the bloating. If you had a lower endoscopy (such as a colonoscopy or flexible sigmoidoscopy) you may notice spotting of blood in your stool or on the toilet paper. If you underwent a bowel prep for your procedure, then you may not have a normal bowel movement for a few days.  DIET:    Drink plenty of fluids but you should avoid alcoholic beverages for 24 hours.  Refer to Dilatation diet for the rest of the day.  Resume your prior diet Tuesday AM.  ACTIVITY: Your care partner should take you home directly after the procedure.  You should plan to take it easy, moving slowly for the rest of the day.  You can resume normal activity the day after the procedure however you should NOT DRIVE or use heavy machinery for 24 hours (because of the sedation medicines used during the test).    SYMPTOMS TO REPORT IMMEDIATELY: A gastroenterologist can be reached at any hour.  During normal business hours, 8:30 AM to 5:00 PM Monday through Friday, call [phone].  After hours and on weekends, please call the GI answering  service at [phone] who will take a message and have the physician on call contact you.     Following upper endoscopy (EGD)  Vomiting of blood or coffee ground material  New chest pain or pain under the shoulder blades  Painful or persistently difficult swallowing  New shortness of breath  Fever of 100F or higher  Black, tarry-looking stools  FOLLOW UP: If any biopsies were taken you will be contacted by phone or by letter within the next 1-3 weeks.  Call your gastroenterologist if you have not heard about the biopsies in 3 weeks.  Our staff will call the home number listed on your records the next business day following your procedure to check on you and address any questions or concerns that you may have at that time regarding the information given to you following your procedure. This is a courtesy call and so if there is no answer at the home number and we have not heard from you through the emergency physician on call, we will assume that you have returned to your regular daily activities without incident.  SIGNATURES/CONFIDENTIALITY: You and/or your care partner have signed paperwork which will be entered into your electronic medical record.  These signatures attest to the fact that that the information above on your After Visit Summary has been reviewed and is understood.  Full responsibility of the confidentiality of this discharge information lies with you and/or your care-partner.

## 2011-08-04 NOTE — Telephone Encounter (Signed)
Spoke with patient.  He received something in the mail from the insurance company about hyperglyceridemia, severe.  He is concerned as you have not discussed this with him.  He has a endoscopy this morning, so he won't be able to talk this morning, maybe late this afternoon.  He is going to fax over the paper the insurance sent him for [place] to review. But, patient would like to discuss these labs with you.  Please call.

## 2011-08-04 NOTE — Progress Notes (Signed)
PROPOFOL PER S CAMP CRNA. SEE SCANNED INTRA PROCEDURE REPORT. EWM  PT TOLERATED PROCEDURE WELL. NO PROBLEMS. EWM

## 2011-08-04 NOTE — Op Note (Signed)
[facility] [address] [place], [place]  [address]  ENDOSCOPY PROCEDURE REPORT  PATIENT:  Samuel Golden, Samuel Golden  MR#:  [id number] BIRTHDATE:  1960-06-26, 50 yrs. old  GENDER:  male  ENDOSCOPIST:  Vania Rea. Jarold Motto, MD, [facility] Referred by:  Willow Ora, M.D.  PROCEDURE DATE:  08/04/2011 PROCEDURE:  Elease Hashimoto Dilation of Esophagus, EGD, diagnostic 43235 ASA CLASS:  Class II INDICATIONS:  INTERMITTENT DYSPHAGIA  MEDICATIONS:   propofol (Diprivan) 350 mg IV TOPICAL ANESTHETIC:  DESCRIPTION OF PROCEDURE:   After the risks and benefits of the procedure were explained, informed consent was obtained.  The LB GIF-H180 [id number] endoscope was introduced through the mouth and advanced to the second portion of the duodenum.  The instrument was slowly withdrawn as the mucosa was fully examined. <<PROCEDUREIMAGES>>  irregular Z-line. DILATED #33F MALONEY DILATOR.NO HEME OR PAIN. Otherwise the examination was normal. LARGE RUGAE NOTED.    NO LARGE HH NOTED,,,  The scope was then withdrawn from the patient and the procedure completed.  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 RECOMMENDATIONS: 1) Clear liquids until, then soft foods rest iof day. Resume prior diet tomorrow. 2) continue current meds 3) dilatations PRN  ______________________________ Vania Rea. Jarold Motto, MD, [facility]  CC:  n. eSIGNED:   Vania Rea. Collan Schoenfeld at 08/04/2011 11:27 AM  Ricke Hey, [phone]

## 2011-08-04 NOTE — Progress Notes (Signed)
No complaints noted in the recovery room. Maw  Patient did not experience any of the following events: a burn prior to discharge; a fall within the facility; wrong site/side/patient/procedure/implant event; or a hospital transfer or hospital admission upon discharge from the facility. (G8907) Patient did not have preoperative order for IV antibiotic SSI prophylaxis. (G8918)  

## 2011-08-05 ENCOUNTER — Telehealth: Payer: Self-pay | Admitting: *Deleted

## 2011-08-05 ENCOUNTER — Telehealth: Payer: Self-pay | Admitting: Gastroenterology

## 2011-08-05 NOTE — Telephone Encounter (Signed)
Patient called, was wondering if Dr. Jarold Motto saw a Hiatal Hernia during his EGD yesterday. Explained that in the report he stated NO HH NOTED. He thanked me for the help.

## 2011-08-05 NOTE — Telephone Encounter (Signed)
Left message on number given in admitting yesterday that states this is scott. ewm

## 2011-08-06 ENCOUNTER — Ambulatory Visit (INDEPENDENT_AMBULATORY_CARE_PROVIDER_SITE_OTHER): Payer: BC Managed Care – PPO | Admitting: Gastroenterology

## 2011-08-06 ENCOUNTER — Telehealth: Payer: Self-pay | Admitting: *Deleted

## 2011-08-06 DIAGNOSIS — E785 Hyperlipidemia, unspecified: Secondary | ICD-10-CM

## 2011-08-06 DIAGNOSIS — E538 Deficiency of other specified B group vitamins: Secondary | ICD-10-CM

## 2011-08-06 MED ORDER — CYANOCOBALAMIN 500 MCG/0.1ML NA SOLN: NASAL | Status: DC

## 2011-08-06 NOTE — Telephone Encounter (Signed)
Spoke with patient about his [place] Panel  results. Cholesterol profile is similar to previous cholesterol panels, he is not a hyperabsorber, the small dense LDL is elevated, lipoprotein A. is optimal. With these data in mind, Denice is essentially the same, he needs a low carb diet and more exercise. Patient reports he has improved his diet but he's not exercising much. Additionally,  he is having some problems with coverage of his [place] Panel, I will ask administration to look into that, i don't  see that I order it although given his family history it was very important to get it. Plan: Come back in 1 month fasting for FLP, dx hyperlipidemia

## 2011-08-06 NOTE — Telephone Encounter (Signed)
Done

## 2011-08-11 ENCOUNTER — Telehealth: Payer: Self-pay | Admitting: Internal Medicine

## 2011-08-11 NOTE — Telephone Encounter (Signed)
Patient states he received $2000 bill from [facility]. He was not told his labs were being sent to there and feels he is not responsible. Pt states he spoke with Dr. Drue Novel about this and Dr. Drue Novel did not know anything about the labs being sent to [facility]. Pt is faxing over papers from [facility] that says Dr. Alwyn Ren was ordering physician. He would like someone to call him back regarding this matter.

## 2011-08-11 NOTE — Telephone Encounter (Signed)
Samuel Golden, I would talk to Samuel Golden , the Rep for  the [facility] heart lab. I was told by him  that if insurance didn't cover it, the patient will not be charged. I did not order this advanced testing as he had transferred his care to you.

## 2011-08-11 NOTE — Telephone Encounter (Signed)
Spoke with pt & gave him the # to call so he will not have to pay his bill. Pt is upset & requested that one of the physicians contact him.

## 2011-08-12 NOTE — Telephone Encounter (Signed)
Please call pt: I spoke with Weyman Croon, the company rep. What he got is EOB not a bill, he has not been and will not be charge for the panel .

## 2011-08-12 NOTE — Telephone Encounter (Signed)
Left a detailed msg on voicemail.

## 2011-08-12 NOTE — Telephone Encounter (Signed)
[facility] for [facility] managed Delfina Redwood [phone] : i believe this was a clerical error, the lab was not ordered on [place]. LMOM for pt, told him will take care of the problem

## 2011-08-12 NOTE — Telephone Encounter (Signed)
QUESTION ANSWERED NURSE OPENED ANOTHER PHONE MESSAGE.Marland KitchenMarland KitchenEM

## 2011-09-05 ENCOUNTER — Encounter: Payer: Self-pay | Admitting: Internal Medicine

## 2011-09-05 ENCOUNTER — Ambulatory Visit (INDEPENDENT_AMBULATORY_CARE_PROVIDER_SITE_OTHER): Payer: BC Managed Care – PPO | Admitting: Internal Medicine

## 2011-09-05 VITALS — BP 124/78 | HR 62 | Temp 97.8°F | Wt 217.0 lb

## 2011-09-05 DIAGNOSIS — E538 Deficiency of other specified B group vitamins: Secondary | ICD-10-CM

## 2011-09-05 DIAGNOSIS — H9319 Tinnitus, unspecified ear: Secondary | ICD-10-CM | POA: Insufficient documentation

## 2011-09-05 DIAGNOSIS — E7800 Pure hypercholesterolemia, unspecified: Secondary | ICD-10-CM

## 2011-09-05 DIAGNOSIS — F17200 Nicotine dependence, unspecified, uncomplicated: Secondary | ICD-10-CM

## 2011-09-05 DIAGNOSIS — E782 Mixed hyperlipidemia: Secondary | ICD-10-CM

## 2011-09-05 DIAGNOSIS — I4891 Unspecified atrial fibrillation: Secondary | ICD-10-CM

## 2011-09-05 LAB — LDL CHOLESTEROL, DIRECT: Direct LDL: 142.8 mg/dL

## 2011-09-05 LAB — LIPID PANEL
Cholesterol: 224 mg/dL — ABNORMAL HIGH (ref 0–200)
VLDL: 27.6 mg/dL (ref 0.0–40.0)

## 2011-09-05 NOTE — Assessment & Plan Note (Signed)
Quit tobacco a week ago, praised

## 2011-09-05 NOTE — Assessment & Plan Note (Signed)
B12 level was 231, in the low side, prescribed B12 supplementation by GI. Currently doing a nasal spray. Labs

## 2011-09-05 NOTE — Assessment & Plan Note (Addendum)
fasting for labs. Since the last office visit, he had a [place] heart panel----> Results reviewed

## 2011-09-05 NOTE — Assessment & Plan Note (Signed)
Occasional palpitations without red flag symptoms. No change

## 2011-09-05 NOTE — Assessment & Plan Note (Addendum)
See history of present illness, occasional tinnitus and a funny feeling into tongue. Neurological exam is normal. We discussed ENT referral versus wait and see. He prefers to wait, will call if symptoms persistent or severe.

## 2011-09-05 NOTE — Progress Notes (Signed)
  Subjective:    Patient ID: Samuel Golden, male    DOB: 1960/11/29, 51 y.o.   MRN: [id number]  HPI Routine office visit History of atrial fibrillation, he still has occasional palpitations, he is followup regularly by cardiology. Denies any syncope, chest pain, shortness of breath or lower strength edema.  Also has occasional numbness in the tongue---> "feels weird". Also R>L tinnitus on and off. Denies diplopia, headaches, coughing ; no tongue swelling. Hearing is normal.  Had developed a B12 deficiency, see assessment and plan. Currently on supplementation by nose.  GERD, good medication compliance, essentially asymptomatic. Chart was reviewed, had a EGD 2 weeks ago, a occult stricture was diagnosed   Review of Systems See HPI    Objective:   Physical Exam General -- alert, well-developed, and well-nourished.   Neck --no thyromegaly  HEENT -- TMs normal, throat w/o redness, face symmetric and not tender to palpation, EOMI, PERRLA Lungs -- normal respiratory effort, no intercostal retractions, no accessory muscle use, and normal breath sounds.   Heart-- normal rate, regular rhythm, no murmur, and no gallop.   Extremities-- no pretibial edema bilaterally Neurologic-- alert & oriented X3 and strength normal in all extremities. DTRs symmetric Psych-- Cognition and judgment appear intact. Alert and cooperative with normal attention span and concentration.  not anxious appearing and not depressed appearing.       Assessment & Plan:

## 2011-09-10 ENCOUNTER — Encounter: Payer: Self-pay | Admitting: Internal Medicine

## 2011-09-11 MED ORDER — ATORVASTATIN CALCIUM 10 MG PO TABS: 10.0000 mg | ORAL_TABLET | Freq: Every day | ORAL | Status: DC

## 2011-09-11 NOTE — Progress Notes (Signed)
Addended by: Edwena Felty T on: 09/11/2011 01:47 PM   Modules accepted: Orders

## 2011-10-08 ENCOUNTER — Telehealth: Payer: Self-pay | Admitting: Internal Medicine

## 2011-10-08 DIAGNOSIS — M79669 Pain in unspecified lower leg: Secondary | ICD-10-CM

## 2011-10-08 DIAGNOSIS — IMO0002 Reserved for concepts with insufficient information to code with codable children: Secondary | ICD-10-CM

## 2011-10-08 NOTE — Telephone Encounter (Signed)
Refill done.  

## 2011-10-08 NOTE — Telephone Encounter (Signed)
Arrange ortho  referral, DX leg sprain, pain in the calf

## 2011-10-08 NOTE — Telephone Encounter (Signed)
Pt states he would now like an ortho referral for his leg. He states he initially declined in December, but is still having some problems and would like to see an ortho.

## 2011-10-08 NOTE — Telephone Encounter (Signed)
Ok to set up referral? 

## 2011-10-13 ENCOUNTER — Telehealth: Payer: Self-pay | Admitting: Internal Medicine

## 2011-10-13 MED ORDER — VALACYCLOVIR HCL 1 G PO TABS: 1000.0000 mg | ORAL_TABLET | Freq: Three times a day (TID) | ORAL | Status: DC

## 2011-10-13 NOTE — Telephone Encounter (Signed)
Spoke with [facility] & they are faxing the office note.

## 2011-10-13 NOTE — Telephone Encounter (Signed)
Patient stated he has been diagnosed with shingles by another dr., and he is angry that his insurance company is requiring prior autho. He thinks we should be able to prescribe him something that he can take now  Please call at [phone]

## 2011-10-13 NOTE — Telephone Encounter (Signed)
Correction: Records from ortho reviewed, he was seen today and found to have an acute rash x 3 days thought to be shingles.  They prescribe Lidoderm and Ultram.  Although is a little later than 48 since the rash started, he may benefit from Valtrex.  Plan:  Valtrex 1 g 3 times a day #21, no RF  If Lidoderm is not approved by his insurance is okay, he doesn't needs it right now.  Ultram prn for pain.  OV if no better

## 2011-10-13 NOTE — Telephone Encounter (Signed)
Discussed with pt, sent in rx.  

## 2011-10-13 NOTE — Telephone Encounter (Signed)
Records from ortho reviewed, he was seen today and found to have an acute rash x 3 days thought to be shingles. They prescribe Lidoderm and Ultram. Although his other leg he can benefit from Valtrex. Plan: Valtrex 1 g 3 times a day #21, no RF If Lidoderm is not approved by his insurance is okay, he doesn't needs it right now. Ultram prn for pain. OV if no better

## 2011-10-13 NOTE — Telephone Encounter (Signed)
Please advise 

## 2011-10-13 NOTE — Telephone Encounter (Signed)
Please get the ortho visit note, I can't  help without knowing what's going on

## 2011-10-13 NOTE — Telephone Encounter (Signed)
Pt called again stating that we need to take responsibility and prescribe him some medication for his Shingles. He does not think he should have to come into the office to be seen. The ortho we sent him to diagnosed him. Please advise.

## 2011-10-14 ENCOUNTER — Telehealth: Payer: Self-pay | Admitting: Internal Medicine

## 2011-10-14 NOTE — Telephone Encounter (Signed)
Please advise 

## 2011-10-14 NOTE — Telephone Encounter (Signed)
Discussed with pt

## 2011-10-14 NOTE — Telephone Encounter (Signed)
No cream needed at this point.  Arrange for a office visit if he has further questions

## 2011-10-15 ENCOUNTER — Ambulatory Visit (INDEPENDENT_AMBULATORY_CARE_PROVIDER_SITE_OTHER): Payer: BC Managed Care – PPO | Admitting: Internal Medicine

## 2011-10-15 VITALS — BP 114/72 | HR 63 | Temp 97.9°F | Wt 215.0 lb

## 2011-10-15 DIAGNOSIS — B029 Zoster without complications: Secondary | ICD-10-CM

## 2011-10-15 NOTE — Patient Instructions (Signed)

## 2011-10-15 NOTE — Assessment & Plan Note (Addendum)
The patient presents with symptoms consistent with shingles. He is already taking Valtrex. Recommend pain control with Tylenol, Motrin or Ultram which he has been prescribed already. He is intolerant to codeine due to nausea, hopefully he will be able to tolerate Ultram. We discussed shingles at length including its etiology, prognosis and potential complications such as postherpetic neuralgia. I don't think at this point he needs a patch however if he ever develop neuralgia he may need to use them. HIV status (-) 2011  Today , I spent more than 18 min with the patient, >50% of the time counseling

## 2011-10-15 NOTE — Progress Notes (Signed)
  Subjective:    Patient ID: Samuel Golden, male    DOB: 1961-01-31, 51 y.o.   MRN: [id number]  HPI Acute visit Patient requested a referral for orthopedics last week as he was having left leg pain, developed a blistery rash at the back of the left leg on 10/12/2011. The next day he was seen by orthopedic surgery and diagnosed with shingles. They prescribe a cream and a patch (likely Lidoderm) Then he called this office and I added Valtrex 10/13/2011.   Past Medical History  Diagnosis Date  . Atrial fib/flutter, transient 10/11    quickly wnt on NSR  . Duodenitis     gastritis, chronic , hiatal hernia, esophagitis, GERD  . Allergic rhinitis     seasonal  . BPH (benign prostatic hypertrophy)   . Hyperplastic colonic polyp   . Hyperlipidemia   . Hiatal hernia   . Esophageal reflux   . Gastritis   . Personal history of colonic polyps 04/29/2004    hyperplastic  . Stricture and stenosis of esophagus   . Fatty liver   . Duodenitis       Review of Systems So far the pain is moderate, the blisters extended a little bit yesterday. Had a couple of them @ the penis. He has a prescription for Ultram, has not used it yet because he is afraid of side effects like he had with codeine (nausea)     Objective:   Physical Exam  Constitutional: He appears well-developed and well-nourished. No distress.  Genitourinary:     Skin: He is not diaphoretic.             Assessment & Plan:

## 2011-10-16 ENCOUNTER — Encounter: Payer: Self-pay | Admitting: Internal Medicine

## 2011-10-27 ENCOUNTER — Other Ambulatory Visit (INDEPENDENT_AMBULATORY_CARE_PROVIDER_SITE_OTHER): Payer: BC Managed Care – PPO

## 2011-10-27 DIAGNOSIS — E785 Hyperlipidemia, unspecified: Secondary | ICD-10-CM

## 2011-10-27 LAB — LIPID PANEL
HDL: 48.5 mg/dL (ref >39.00)
Triglycerides: 109 mg/dL (ref 0.0–149.0)

## 2011-10-27 LAB — AST: AST: 22 U/L (ref 0–37)

## 2011-10-27 NOTE — Progress Notes (Signed)
Labs only

## 2011-10-29 ENCOUNTER — Encounter: Payer: Self-pay | Admitting: *Deleted

## 2011-12-20 ENCOUNTER — Other Ambulatory Visit: Payer: Self-pay | Admitting: Gastroenterology

## 2012-05-24 ENCOUNTER — Telehealth: Payer: Self-pay | Admitting: Internal Medicine

## 2012-05-24 NOTE — Telephone Encounter (Signed)
Samuel Golden, thank you for your help, I think your advise was appropriate

## 2012-05-24 NOTE — Telephone Encounter (Signed)
Patient Information:  Caller Name: [place]  Phone: [phone]  Patient: Samuel Golden, Samuel Golden  Gender: Male  DOB: March 27, 1961  Age: 51 Years  PCP: Willow Ora  Office Follow Up:  Does the office need to follow up with this patient?: Yes  Instructions For The Office: Please call ASAP, patient frustrated and either wants to come in today or have antibiotic called in.  RN Note:  Patient is requesting antibiotics, he is willing to come in to this office today  but no appointments available for today. He states he can not come in tomorrow and wants to start on antibiotics as soon as possible. He has hx of sinus infections and knows this is an infection. Patient states he is frustrated and would like a call back ASAP. Please call.  Symptoms  Reason For Call & Symptoms: congestion sneezing, watery eyes, milky white nasal drainage, ringing in right ear  Reviewed Health History In EMR: Yes  Reviewed Medications In EMR: Yes  Reviewed Allergies In EMR: Yes  Reviewed Surgeries / Procedures: Yes  Date of Onset of Symptoms: 05/20/2012  Guideline(s) Used:  Sinus Pain and Congestion  Disposition Per Guideline:   See Today or Tomorrow in Office  Reason For Disposition Reached:   Patient wants to be seen  Advice Given:  For a Stuffy Nose - Use Nasal Washes:  Introduction: Saline (salt water) nasal irrigation (nasal wash) is an effective and simple home remedy for treating stuffy nose and sinus congestion. The nose can be irrigated by pouring, spraying, or squirting salt water into the nose and then letting it run back out.  Hydration:  Drink plenty of liquids (6-8 glasses of water daily). If the air in your home is dry, use a cool mist humidifier  Call Back If:   You become worse.

## 2012-05-24 NOTE — Telephone Encounter (Signed)
Spoke with patient, upset that we are "inflexible and unwilling to see him today". Explained to the patient that we have not available appointments today but have openings tomorrow and we could schedule him tomorrow with one of our physicians. 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 I apologized for the inconvenience and again offered appointment for tomorrow, he declined stating he could not come in tomorrow. Encouraged him to go to UC if needed to be seen today.

## 2012-06-21 ENCOUNTER — Ambulatory Visit (INDEPENDENT_AMBULATORY_CARE_PROVIDER_SITE_OTHER): Payer: BC Managed Care – PPO | Admitting: Internal Medicine

## 2012-06-21 ENCOUNTER — Encounter: Payer: Self-pay | Admitting: Internal Medicine

## 2012-06-21 VITALS — BP 118/70 | HR 72 | Temp 97.7°F | Ht 74.5 in | Wt 215.0 lb

## 2012-06-21 DIAGNOSIS — Z23 Encounter for immunization: Secondary | ICD-10-CM

## 2012-06-21 DIAGNOSIS — K219 Gastro-esophageal reflux disease without esophagitis: Secondary | ICD-10-CM

## 2012-06-21 DIAGNOSIS — E538 Deficiency of other specified B group vitamins: Secondary | ICD-10-CM

## 2012-06-21 DIAGNOSIS — J301 Allergic rhinitis due to pollen: Secondary | ICD-10-CM

## 2012-06-21 DIAGNOSIS — Z0000 Encounter for general adult medical examination without abnormal findings: Secondary | ICD-10-CM

## 2012-06-21 DIAGNOSIS — H9319 Tinnitus, unspecified ear: Secondary | ICD-10-CM

## 2012-06-21 DIAGNOSIS — E782 Mixed hyperlipidemia: Secondary | ICD-10-CM

## 2012-06-21 DIAGNOSIS — I4891 Unspecified atrial fibrillation: Secondary | ICD-10-CM

## 2012-06-21 MED ORDER — MOMETASONE FUROATE 50 MCG/ACT NA SUSP: 2.0000 | Freq: Every day | NASAL | Status: AC

## 2012-06-21 NOTE — Progress Notes (Signed)
  Subjective:    Patient ID: Samuel Golden, male    DOB: [birth year], 52 y.o.   MRN: [id number]  HPI Complete physical exam  Past Medical History  Diagnosis Date  . Atrial fib/flutter, transient 10/11    quickly wnt on NSR  . Duodenitis     gastritis, chronic , hiatal hernia, esophagitis, GERD  . Allergic rhinitis     seasonal  . BPH (benign prostatic hypertrophy)   . Hyperplastic colonic polyp   . Hyperlipidemia   . Hiatal hernia   . Esophageal reflux   . Gastritis   . Personal history of colonic polyps 04/29/2004    hyperplastic  . Stricture and stenosis of esophagus   . Fatty liver    Past Surgical History  Procedure Date  . Tonsillectomy   . Tympanostomy tube placement     for recurrent otitis   History   Social History  . Marital Status: Single    Spouse Name: N/A    Number of Children: 0  . Years of Education: N/A   Occupational History  . 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     Social History Main Topics  . Smoking status: Current Every Day Smoker -- 0.6 packs/day  . Smokeless tobacco: Never Used  . Alcohol Use: Yes     Comment: socially   . Drug Use: Yes     Comment: occ pot  . Sexually Active: Not on file   Other Topics Concern  . Not on file   Social History Narrative   Exercise: little, active at work ---Diet: healthy for the most part   Family History  Problem Relation Age of Onset  . Diabetes Father   . Hyperlipidemia Father   . Hypertension Father   . Heart attack Father 54  . Prostate cancer Father 59  . Diabetes Mother   . Hyperlipidemia Mother   . Hypertension Mother   . Diabetes Cousin   . Coronary artery disease      many members of his family  . Colon cancer Neg Hx   . Colitis      aunt x 2  . Crohn's disease      aunt x 2  . Crohn's disease Maternal Aunt     Review of Systems Since the last time he was here, he saw cardiology, got good  reports. Denies substernal chest pain, occasionally has a spasm in the neck that goes to the upper  chest. Denies shortness of breath.  Occasionally, his heartbeats are perceived as a strong, has discussed this sx w/ his heart doctor. No nausea, vomiting, diarrhea. No dysuria or gross hematuria. Denies any heartburn at this point, no difficulty swallowing. Still has occasional tinnitus, worse on the right, denies decreased hearing. Has some sinus congestion, see assessment and plan.    Objective:   Physical Exam  General -- alert, well-developed, and well-nourished.   Neck --no thyromegaly Lungs -- normal respiratory effort, no intercostal retractions, no accessory muscle use, and normal breath sounds.   Heart-- normal rate, regular rhythm, no murmur, and no gallop.   Abdomen--soft, non-tender, no distention, no masses, no HSM, no guarding, and no rigidity.   Extremities-- no pretibial edema bilaterally Neurologic-- alert & oriented X3 and strength normal in all extremities. Psych-- Cognition and judgment appear intact. Alert and cooperative with normal attention span and concentration.  not anxious appearing and not depressed appearing.      Assessment & Plan:

## 2012-06-21 NOTE — Assessment & Plan Note (Addendum)
Was treated with amoxicillin at a walk-in clinic few days ago, still has some nasal congestion, recommend nasonex, sample and a Rx provided

## 2012-06-21 NOTE — Assessment & Plan Note (Signed)
Currently asymptomatic 

## 2012-06-21 NOTE — Assessment & Plan Note (Addendum)
Td ~ 2006 flu shot today Colonoscopy 2005, normal, next per GI Tobacco -- counseled Labs @ cards, from 03-2012: Total cholesterol 204, HDL 49, LDL 115 (satisfactory per  cardiology ) CMP, PSA and TSH normal. diet and exercise discussed

## 2012-06-21 NOTE — Assessment & Plan Note (Signed)
Still has occasional tinnitus, R>L, no decreased hearing. Will recommend observation

## 2012-06-21 NOTE — Assessment & Plan Note (Signed)
Lastly 12 were within normal, continue with supplementation of B12

## 2012-06-21 NOTE — Assessment & Plan Note (Addendum)
Stable, saw cardiology is  11- 2013

## 2012-06-21 NOTE — Assessment & Plan Note (Addendum)
Never started Lipitor, last LDL 118   at the cardiology office in November 2013. Recommend to continue with his healthy   diet and increase exercise

## 2012-10-05 IMAGING — US US ABDOMEN COMPLETE
1 series · 14 of 25 positions shown · non-contrast
Comparison: Ultrasound abdomen 11/02/2006.

CLINICAL DATA: Abdominal pain.

COMPLETE ABDOMINAL ULTRASOUND

[Series 1: us abdomen complete · 0.30mm/px · 14 of 71 slices shown]
[im 1/71]
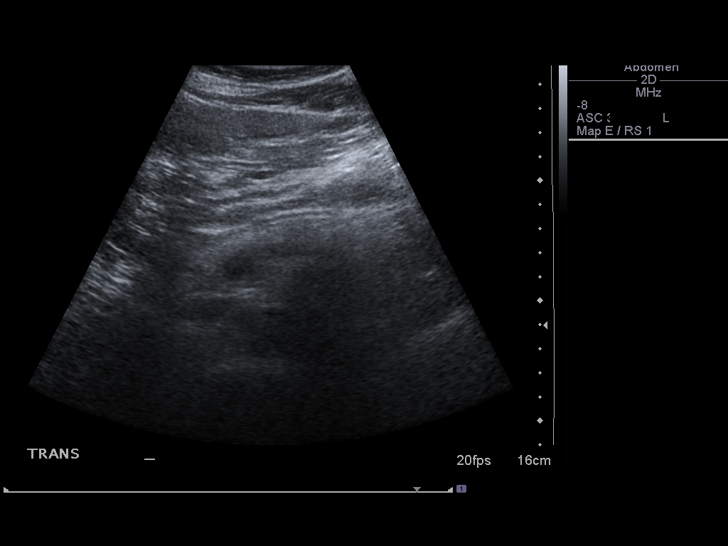
[im 6/71]
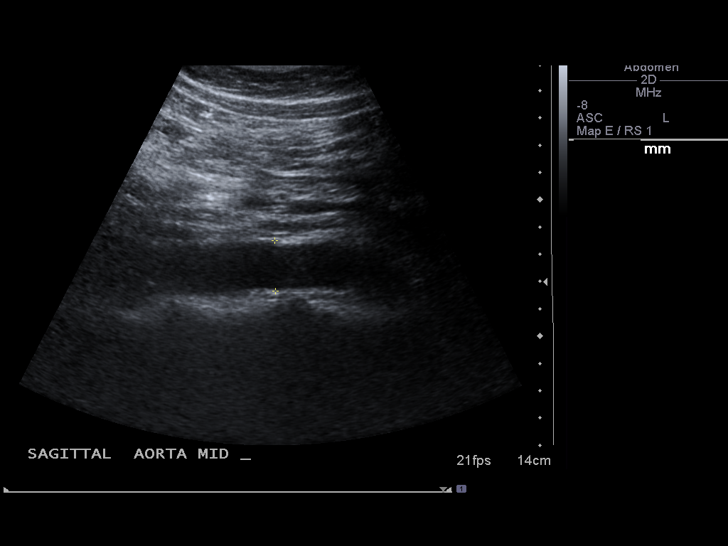
[im 12/71]
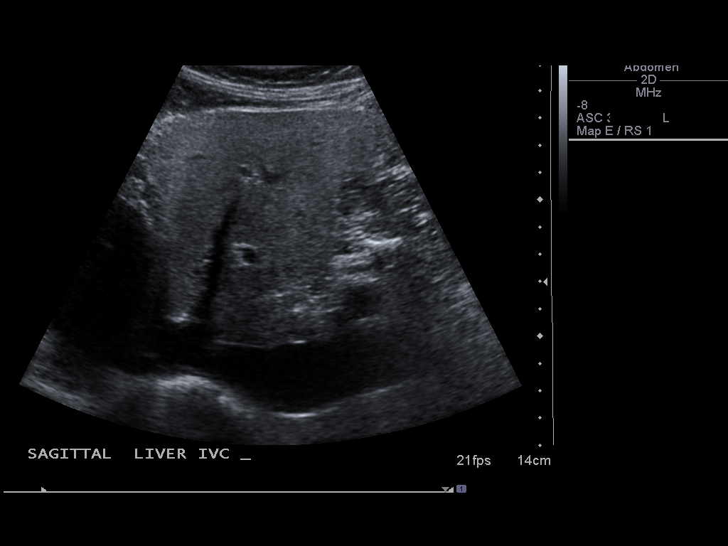
[im 18/71]
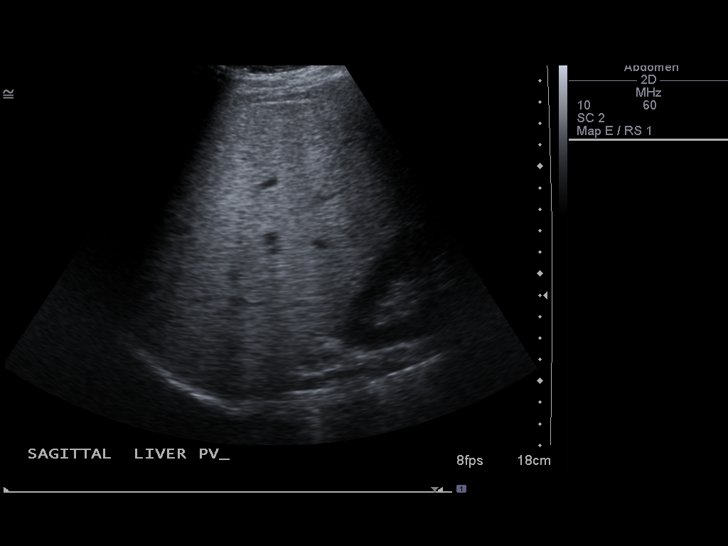
[im 24/71]
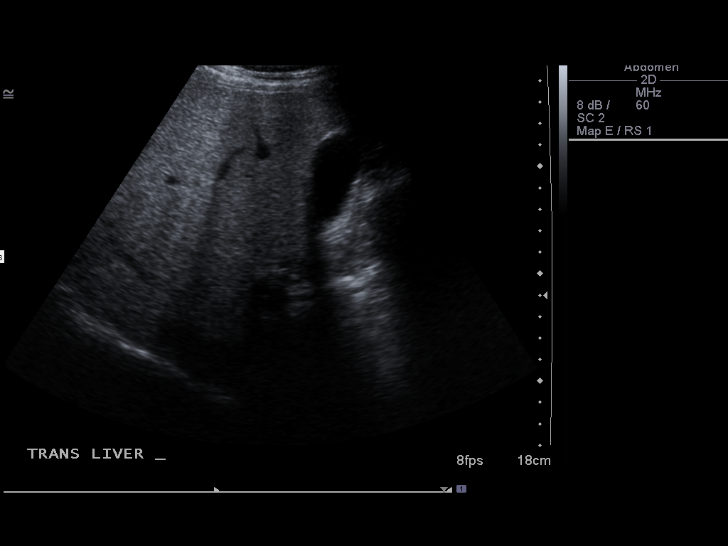
[im 27/71]
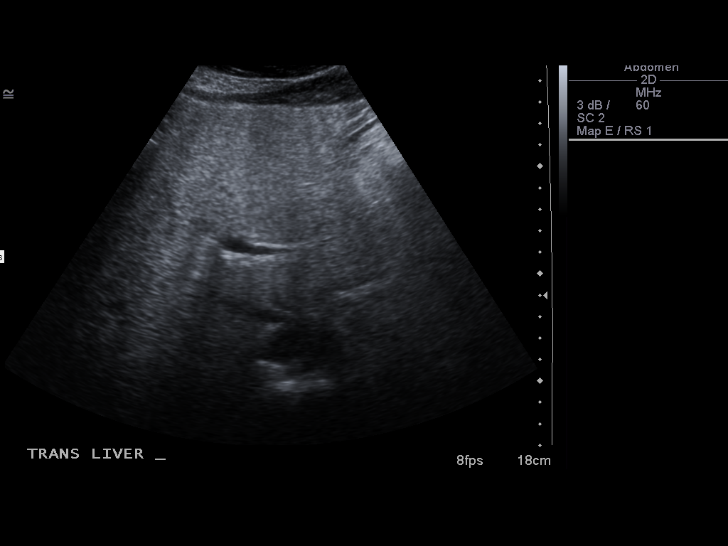
[im 33/71]
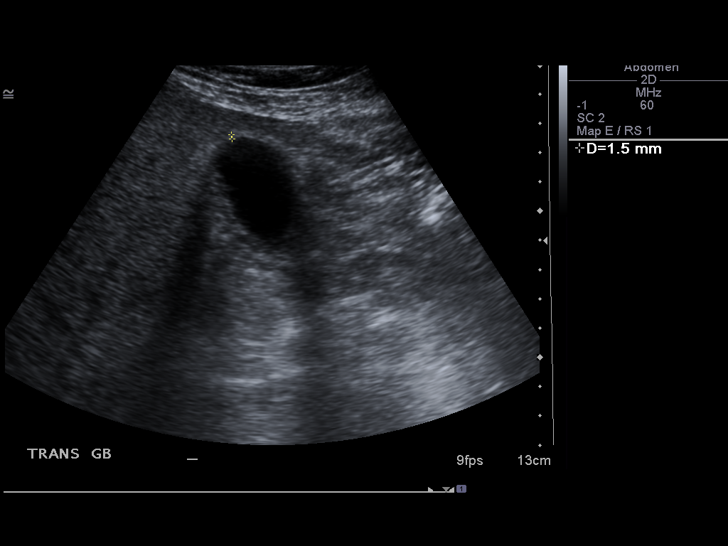
[im 38/71]
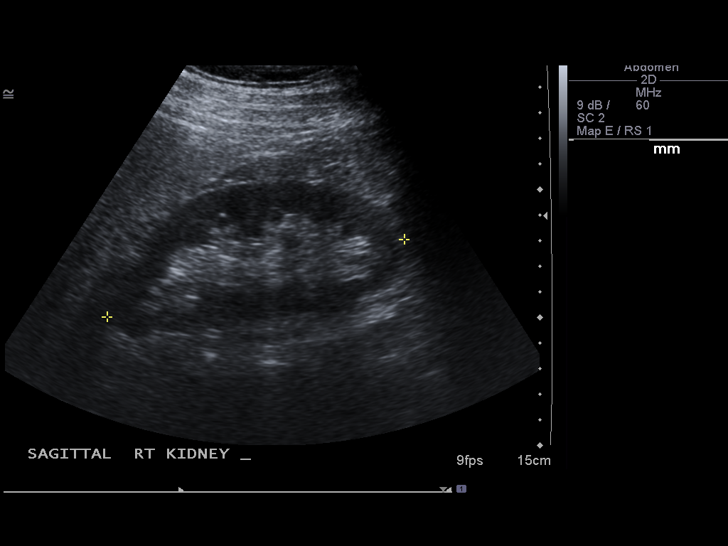
[im 44/71]
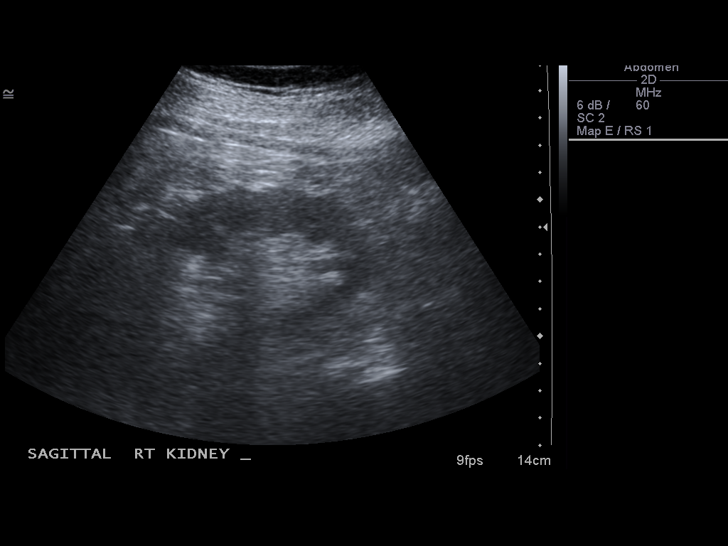
[im 47/71]
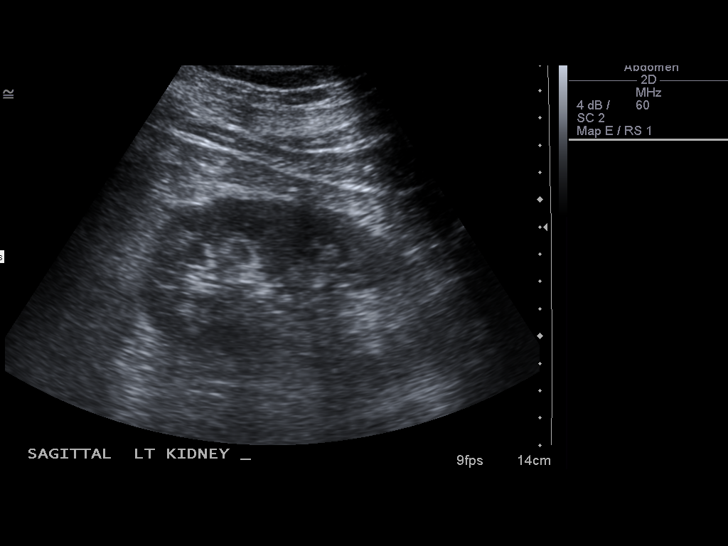
[im 53/71]
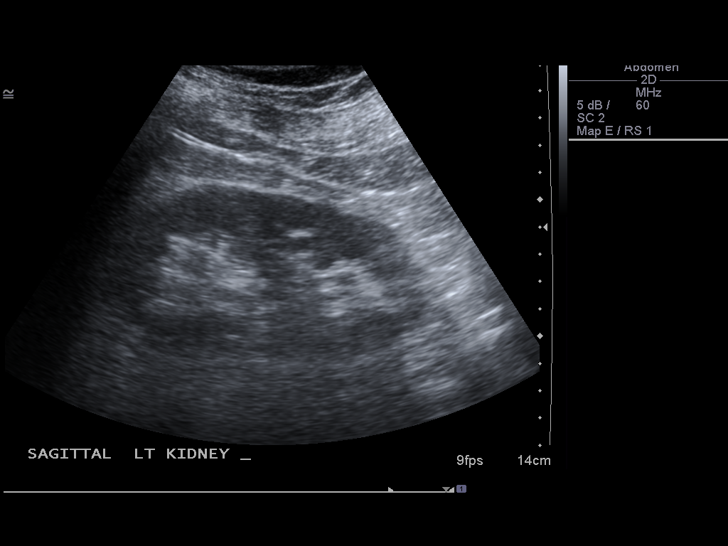
[im 59/71]
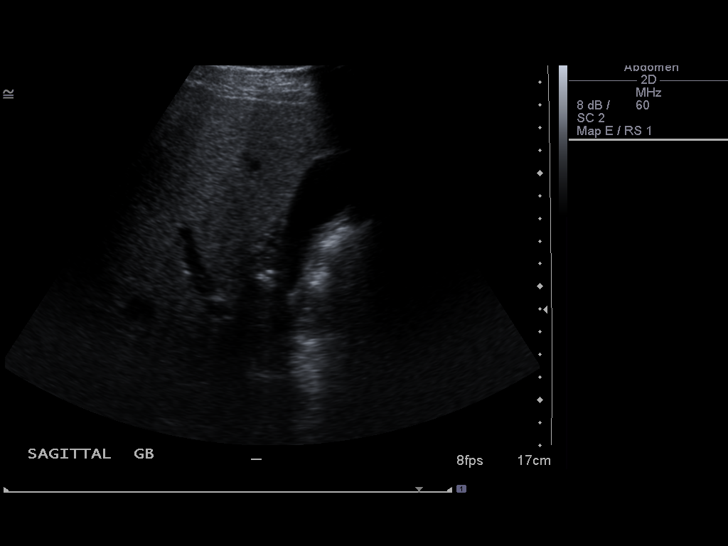
[im 65/71]
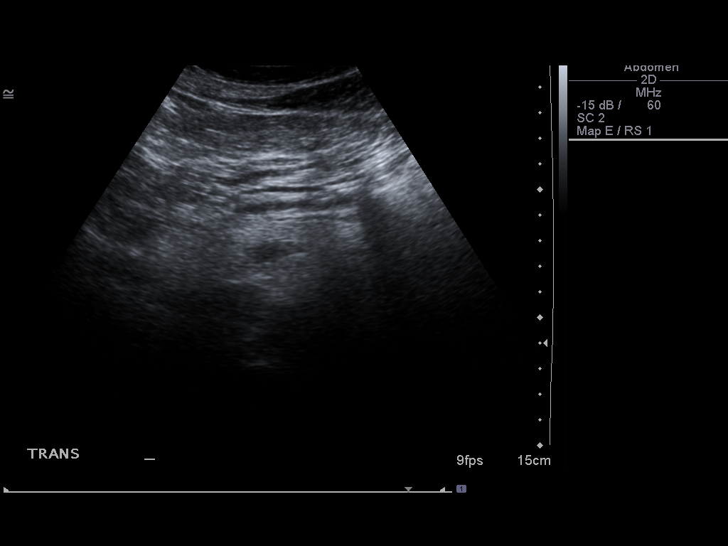
[im 71/71]
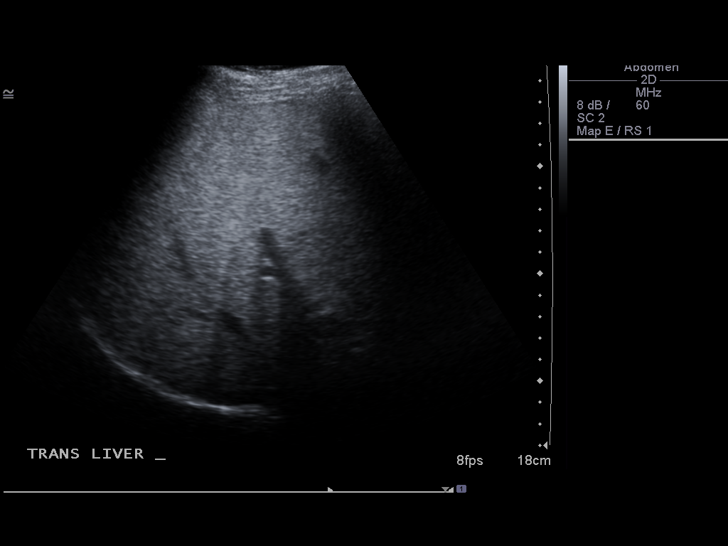

[14 of 25 positions shown; findings below may reference images not displayed]

FINDINGS: Gallbladder:  No gallstones, gallbladder wall thickening, or
pericholecystic fluid.

Common bile duct:  Normal in caliber measuring 3.2 mm.

Liver:  Increased echogenicity with poor through transmission
suggesting diffuse fatty infiltration.  No focal hepatic lesions or
intrahepatic ductal dilatation.

IVC:  Normal caliber.

Pancreas:  Sonographically unremarkable.

Spleen:  Normal in size and echogenicity without focal lesions.

Right Kidney:  12.0 cm in length. Normal renal cortical thickness
and echogenicity without focal lesions or hydronephrosis.

Left Kidney:  11.7 cm in length. Normal renal cortical thickness
and echogenicity without focal lesions or hydronephrosis.

Abdominal aorta:  Normal caliber.
IMPRESSION: 1.  Normal sonographic appearance of the gallbladder and normal
caliber common bile duct.
2.  Diffuse fatty infiltration of the liver.
3.  Normal sonographic appearance of the pancreas, spleen and both
kidneys.

## 2013-10-28 ENCOUNTER — Encounter: Payer: Self-pay | Admitting: Nurse Practitioner

## 2014-03-22 ENCOUNTER — Encounter: Payer: Self-pay | Admitting: Gastroenterology

## 2014-03-22 ENCOUNTER — Ambulatory Visit (INDEPENDENT_AMBULATORY_CARE_PROVIDER_SITE_OTHER): Payer: BC Managed Care – PPO | Admitting: Gastroenterology

## 2014-03-22 VITALS — BP 112/60 | HR 80 | Ht 73.25 in | Wt 200.5 lb

## 2014-03-22 DIAGNOSIS — R198 Other specified symptoms and signs involving the digestive system and abdomen: Secondary | ICD-10-CM

## 2014-03-22 DIAGNOSIS — K219 Gastro-esophageal reflux disease without esophagitis: Secondary | ICD-10-CM

## 2014-03-22 DIAGNOSIS — K921 Melena: Secondary | ICD-10-CM

## 2014-03-22 DIAGNOSIS — K5900 Constipation, unspecified: Secondary | ICD-10-CM

## 2014-03-22 DIAGNOSIS — R194 Change in bowel habit: Secondary | ICD-10-CM

## 2014-03-22 MED ORDER — MOVIPREP 100 G PO SOLR: ORAL | Status: DC

## 2014-03-22 NOTE — Progress Notes (Signed)
    History of Present Illness: This is a 53 year old male who relates a change in bowel habits. He had an acute intestinal illness with significant watery diarrhea. Severe symptoms abated after several days however he had persistent problems with diarrhea for about 3 or 4 weeks. He eventually discontinued all milk products and his diarrhea completely resolved. His bowel habits have returned to his typical pattern of chronic constipation. He notes small pellet-like stools with frequent straining with bowel movements. He notes bright red blood on the tissue paper following bowel movements for most of his adult life. He states he has hemorrhoids. He underwent colonoscopy by Dr. Sharlett Iles in December 2005 for rectal bleeding in the colonoscopy was normal. His chronic reflux symptoms are well controlled on omeprazole 20 mg daily. Denies weight loss, abdominal pain, diarrhea, change in stool caliber, melena, nausea, vomiting, dysphagia, reflux symptoms, chest pain.  Current Medications, Allergies, Past Medical History, Past Surgical History, Family History and Social History were reviewed in [employer] record.  Physical Exam: General: Well developed , well nourished, no acute distress Head: Normocephalic and atraumatic Eyes:  sclerae anicteric, EOMI Ears: Normal auditory acuity Mouth: No deformity or lesions Lungs: Clear throughout to auscultation Heart: Regular rate and rhythm; no murmurs, rubs or bruits Abdomen: Soft, mild LLQ tenderness and non distended. No masses, hepatosplenomegaly or hernias noted. Normal Bowel sounds Rectal: no lesions, no tenderness, heme negative stool Musculoskeletal: Symmetrical with no gross deformities  Pulses:  Normal pulses noted Extremities: No clubbing, cyanosis, edema or deformities noted Neurological: Alert oriented x 4, grossly nonfocal Psychological:  Alert and cooperative. Normal mood and affect  Assessment and Recommendations:  1.  Hematochezia, constipation, mild LLQ tenderness. Self-limited change in bowel habits was likely infectious, postinfectious and lactose intolerance. Increase daily fluid intake and dietary fiber intake. He prefers to increase his intake of prunes. Consider the addition of MiraLAX if more fiber and fluids are not effective. The risks, benefits, and alternatives to colonoscopy with possible biopsy and possible polypectomy were discussed with the patient and they consent to proceed.   2. GERD. Continue omeprazole 20 mg daily and antireflux measures.

## 2014-03-22 NOTE — Patient Instructions (Addendum)
You have been scheduled for a colonoscopy. Please follow written instructions given to you at your visit today.  Please pick up your prep kit at the pharmacy within the next 1-3 days.  Rebate coupon provided. If you use inhalers (even only as needed), please bring them with you on the day of your procedure. Your physician has requested that you go to www.startemmi.com and enter the access code given to you at your visit today. This web site gives a general overview about your procedure. However, you should still follow specific instructions given to you by our office regarding your preparation for the procedure.  Increase your prune intake as we discussed.  I appreciate the opportunity to care for you.

## 2014-03-27 ENCOUNTER — Encounter: Payer: Self-pay | Admitting: Gastroenterology

## 2014-04-18 ENCOUNTER — Encounter: Payer: Self-pay | Admitting: Gastroenterology

## 2014-04-18 ENCOUNTER — Ambulatory Visit (AMBULATORY_SURGERY_CENTER): Payer: BC Managed Care – PPO | Admitting: Gastroenterology

## 2014-04-18 VITALS — BP 127/58 | HR 61 | Temp 97.1°F | Resp 16 | Ht 73.0 in | Wt 200.0 lb

## 2014-04-18 DIAGNOSIS — K921 Melena: Secondary | ICD-10-CM

## 2014-04-18 DIAGNOSIS — D123 Benign neoplasm of transverse colon: Secondary | ICD-10-CM

## 2014-04-18 MED ORDER — SODIUM CHLORIDE 0.9 % IV SOLN: 500.0000 mL | INTRAVENOUS | Status: DC

## 2014-04-18 NOTE — Progress Notes (Signed)
Report to PACU, RN, vss, BBS= Clear.  

## 2014-04-18 NOTE — Progress Notes (Signed)
Called to room to assist during endoscopic procedure.  Patient ID and intended procedure confirmed with present staff. Received instructions for my participation in the procedure from the performing physician.  

## 2014-04-18 NOTE — Patient Instructions (Signed)
YOU HAD AN ENDOSCOPIC PROCEDURE TODAY AT THE [facility]: Refer to the procedure report that was given to you for any specific questions about what was found during the examination.  If the procedure report does not answer your questions, please call your gastroenterologist to clarify.  If you requested that your care partner not be given the details of your procedure findings, then the procedure report has been included in a sealed envelope for you to review at your convenience later.  YOU SHOULD EXPECT: Some feelings of bloating in the abdomen. Passage of more gas than usual.  Walking can help get rid of the air that was put into your GI tract during the procedure and reduce the bloating. If you had a lower endoscopy (such as a colonoscopy or flexible sigmoidoscopy) you may notice spotting of blood in your stool or on the toilet paper. If you underwent a bowel prep for your procedure, then you may not have a normal bowel movement for a few days.  DIET: Your first meal following the procedure should be a light meal and then it is ok to progress to your normal diet.  A half-sandwich or bowl of soup is an example of a good first meal.  Heavy or fried foods are harder to digest and may make you feel nauseous or bloated.  Likewise meals heavy in dairy and vegetables can cause extra gas to form and this can also increase the bloating.  Drink plenty of fluids but you should avoid alcoholic beverages for 24 hours.  ACTIVITY: Your care partner should take you home directly after the procedure.  You should plan to take it easy, moving slowly for the rest of the day.  You can resume normal activity the day after the procedure however you should NOT DRIVE or use heavy machinery for 24 hours (because of the sedation medicines used during the test).    SYMPTOMS TO REPORT IMMEDIATELY: A gastroenterologist can be reached at any hour.  During normal business hours, 8:30 AM to 5:00 PM Monday through Friday,  call [phone].  After hours and on weekends, please call the GI answering service at [phone] who will take a message and have the physician on call contact you.   Following lower endoscopy (colonoscopy or flexible sigmoidoscopy):  Excessive amounts of blood in the stool  Significant tenderness or worsening of abdominal pains  Swelling of the abdomen that is new, acute  Fever of 100F or higher  FOLLOW UP: If any biopsies were taken you will be contacted by phone or by letter within the next 1-3 weeks.  Call your gastroenterologist if you have not heard about the biopsies in 3 weeks.  Our staff will call the home number listed on your records the next business day following your procedure to check on you and address any questions or concerns that you may have at that time regarding the information given to you following your procedure. This is a courtesy call and so if there is no answer at the home number and we have not heard from you through the emergency physician on call, we will assume that you have returned to your regular daily activities without incident.  SIGNATURES/CONFIDENTIALITY: You and/or your care partner have signed paperwork which will be entered into your electronic medical record.  These signatures attest to the fact that that the information above on your After Visit Summary has been reviewed and is understood.  Full responsibility of the confidentiality of this   discharge information lies with you and/or your care-partner  . information polyps & hemorrhoids given to you today

## 2014-04-18 NOTE — Op Note (Signed)
[place]  [address] [place], [id number]   COLONOSCOPY PROCEDURE REPORT  PATIENT: Samuel Golden, Samuel Golden  MR#: [id number] BIRTHDATE: 1961-05-12 , 2  yrs. old GENDER: male ENDOSCOPIST: Ladene Artist, MD, [facility] PROCEDURE DATE:  04/18/2014 PROCEDURE:   Colonoscopy with snare polypectomy First Screening Colonoscopy - Avg.  risk and is 50 yrs.  old or older - No.  Prior Negative Screening - Now for repeat screening. N/A  History of Adenoma - Now for follow-up colonoscopy & has been > or = to 3 yrs.  N/A  Polyps Removed Today? Yes. ASA CLASS:   Class II INDICATIONS:hematochezia. MEDICATIONS: Monitored anesthesia care and Propofol 340 mg IV DESCRIPTION OF PROCEDURE:   After the risks benefits and alternatives of the procedure were thoroughly explained, informed consent was obtained.  The digital rectal exam revealed no abnormalities of the rectum.   The [id number]  endoscope was introduced through the anus and advanced to the cecum, which was identified by both the appendix and ileocecal valve. No adverse events experienced with a tortuous colon.   The quality of the prep was good, using MoviPrep  The instrument was then slowly withdrawn as the colon was fully examined.    COLON FINDINGS: A sessile polyp measuring 6 mm in size was found in the transverse colon.  Polypectomies were performed with a cold snare.  The resection was complete, the polyp tissue was completely retrieved and sent to histology.   The examination was otherwise normal.  Retroflexed views revealed internal Grade I hemorrhoids. The time to cecum=4 minutes 45 seconds.  Withdrawal time=11 minutes 18 seconds.  The scope was withdrawn and the procedure completed.  COMPLICATIONS: There were no immediate complications.  ENDOSCOPIC IMPRESSION: 1.   Sessile polyp in the transverse colon; polypectomy performed with a cold snare 2.   Grade l internal hemorrhoids  RECOMMENDATIONS: 1.  Repeat  colonoscopy in 5 years if polyp adenomatous; otherwise 10 years  eSigned:  Ladene Artist, MD, [facility] 04/18/2014 2:42 PM

## 2014-04-19 ENCOUNTER — Telehealth: Payer: Self-pay | Admitting: *Deleted

## 2014-04-19 NOTE — Telephone Encounter (Signed)
  Follow up Call-  Call back number 04/18/2014 08/04/2011  Post procedure Call Back phone  # [phone]  Permission to leave phone message Yes Yes     Patient questions:  Do you have a fever, pain , or abdominal swelling? No. Pain Score  0 *  Have you tolerated food without any problems? Yes.    Have you been able to return to your normal activities? Yes.    Do you have any questions about your discharge instructions: Diet   No. Medications  No. Follow up visit  No.  Do you have questions or concerns about your Care? No.  Actions: * If pain score is 4 or above: No action needed, pain <4.

## 2014-04-28 ENCOUNTER — Encounter: Payer: Self-pay | Admitting: Gastroenterology

## 2014-12-13 ENCOUNTER — Encounter: Payer: Self-pay | Admitting: Gastroenterology

## 2016-04-28 ENCOUNTER — Ambulatory Visit (INDEPENDENT_AMBULATORY_CARE_PROVIDER_SITE_OTHER): Payer: Self-pay | Admitting: Orthopedic Surgery

## 2019-04-25 ENCOUNTER — Encounter: Payer: Self-pay | Admitting: Gastroenterology

## 2021-05-31 ENCOUNTER — Encounter: Payer: Self-pay | Admitting: Gastroenterology

## 2022-03-16 ENCOUNTER — Encounter (HOSPITAL_COMMUNITY): Payer: Self-pay | Admitting: Emergency Medicine

## 2022-03-16 ENCOUNTER — Emergency Department (HOSPITAL_COMMUNITY)
Admission: EM | Admit: 2022-03-16 | Discharge: 2022-03-16 | Disposition: A | Discharge status: Home / Self Care | Payer: BLUE CROSS/BLUE SHIELD | Attending: Emergency Medicine | Admitting: Emergency Medicine

## 2022-03-16 ENCOUNTER — Other Ambulatory Visit: Payer: Self-pay

## 2022-03-16 ENCOUNTER — Emergency Department (HOSPITAL_COMMUNITY): Payer: BLUE CROSS/BLUE SHIELD

## 2022-03-16 DIAGNOSIS — Z7982 Long term (current) use of aspirin: Secondary | ICD-10-CM | POA: Insufficient documentation

## 2022-03-16 DIAGNOSIS — I4891 Unspecified atrial fibrillation: Secondary | ICD-10-CM

## 2022-03-16 DIAGNOSIS — Z79899 Other long term (current) drug therapy: Secondary | ICD-10-CM | POA: Insufficient documentation

## 2022-03-16 DIAGNOSIS — R42 Dizziness and giddiness: Secondary | ICD-10-CM | POA: Diagnosis present

## 2022-03-16 LAB — BASIC METABOLIC PANEL
Anion gap: 8 (ref 5–15)
BUN: 9 mg/dL (ref 8–23)
CO2: 25 mmol/L (ref 22–32)
Calcium: 8.6 mg/dL — ABNORMAL LOW (ref 8.9–10.3)
Chloride: 100 mmol/L (ref 98–111)
Creatinine, Ser: 0.7 mg/dL (ref 0.61–1.24)
GFR, Estimated: >60 mL/min (ref >60)
Glucose, Bld: 96 mg/dL (ref 70–99)
Potassium: 4.1 mmol/L (ref 3.5–5.1)
Sodium: 133 mmol/L — ABNORMAL LOW (ref 135–145)

## 2022-03-16 LAB — CBC
HCT: 42.3 % (ref 39.0–52.0)
Hemoglobin: 14.1 g/dL (ref 13.0–17.0)
MCH: 33.2 pg (ref 26.0–34.0)
MCHC: 33.3 g/dL (ref 30.0–36.0)
MCV: 99.5 fL (ref 80.0–100.0)
Platelets: 242 10*3/uL (ref 150–400)
RBC: 4.25 MIL/uL (ref 4.22–5.81)
RDW: 13.1 % (ref 11.5–15.5)
WBC: 9.3 10*3/uL (ref 4.0–10.5)
nRBC: 0 % (ref 0.0–0.2)

## 2022-03-16 LAB — MAGNESIUM: Magnesium: 1.8 mg/dL (ref 1.7–2.4)

## 2022-03-16 NOTE — ED Provider Notes (Signed)
[place] DEPT Provider Note   CSN: [id number] Arrival date & time: 03/16/22  1233     History  Chief Complaint  Patient presents with   Atrial Fibrillation   Dizziness    Samuel Golden is a 61 y.o. male.  61 year old male presents with cute onset of palpitations approximately 2 hours prior to arrival.  History of A-fib in the past.  Patient took 3 doses of flecainide and now feels that he is back in sinus rhythm.  He did experience some dizziness and lightheaded with this.  States his heart rate was up to 217.  He denies any anginal type symptoms.  No recent illnesses.  Endorses daily alcohol use and denies any illicit drug use        Home Medications Prior to Admission medications   Medication Sig Start Date End Date Taking? Authorizing Provider  Ascorbic Acid (VITAMIN C) 1000 MG tablet Take 1,000 mg by mouth daily.    [provider]  aspirin 325 MG EC tablet Take 325 mg by mouth daily.      [provider]  B Complex Vitamins (VITAMIN B COMPLEX PO) Take 1 tablet by mouth daily.    [provider]  Cholecalciferol (VITAMIN D) 2000 UNITS tablet Take 2,000 Units by mouth daily.    [provider]  co-enzyme Q-10 30 MG capsule Take 30 mg by mouth 3 (three) times daily.      [provider]  flecainide (TAMBOCOR) 100 MG tablet Take 100 mg by mouth 2 (two) times daily.    [provider]  GNP GARLIC EXTRACT PO Take 381 mg by mouth daily.    [provider]  HAWTHORNE BERRY PO Take 565 mg by mouth daily.    [provider]  LECITHIN PO Take 1 tablet by mouth daily. Pt takes 1360 mg    [provider]  MILK THISTLE-DAND-FENNEL-LICOR PO Take 017 mg by mouth daily.    [provider]  mometasone (NASONEX) 50 MCG/ACT nasal spray Place 2 sprays into the nose daily. 06/21/12   Colon Branch, MD  NON FORMULARY FENNEL.  Once daily.    [provider]  Omega-3  Fatty Acids (SALMON OIL PO) Take by mouth as directed. Pt takes 600 mg    [provider]  omeprazole (PRILOSEC OTC) 20 MG tablet Take 20 mg by mouth daily.    [provider]  Red Yeast Rice Extract (RED YEAST RICE PO) Take 1 tablet by mouth daily. Pt takes 1200 mg    [provider]  saw palmetto 160 MG capsule Take 160 mg by mouth daily.    [provider]      Allergies    Codeine    Review of Systems   Review of Systems  All other systems reviewed and are negative.   Physical Exam Updated Vital Signs BP (!) 136/94 (BP Location: Right Arm)   Pulse (!) 133   Temp 97.7 F (36.5 C) (Oral)   Resp 20   SpO2 100%  Physical Exam Vitals and nursing note reviewed.  Constitutional:      General: He is not in acute distress.    Appearance: Normal appearance. He is well-developed. He is not toxic-appearing.  HENT:     Head: Normocephalic and atraumatic.  Eyes:     General: Lids are normal.     Conjunctiva/sclera: Conjunctivae normal.     Pupils: Pupils are equal, round, and reactive to  light.  Neck:     Thyroid: No thyroid mass.     Trachea: No tracheal deviation.  Cardiovascular:     Rate and Rhythm: Normal rate and regular rhythm.     Heart sounds: Normal heart sounds. No murmur heard.    No gallop.  Pulmonary:     Effort: Pulmonary effort is normal. No respiratory distress.     Breath sounds: Normal breath sounds. No stridor. No decreased breath sounds, wheezing, rhonchi or rales.  Abdominal:     General: There is no distension.     Palpations: Abdomen is soft.     Tenderness: There is no abdominal tenderness. There is no rebound.  Musculoskeletal:        General: No tenderness. Normal range of motion.     Cervical back: Normal range of motion and neck supple.  Skin:    General: Skin is warm and dry.     Findings: No abrasion or rash.  Neurological:     Mental Status: He is alert and oriented to person, place, and time. Mental  status is at baseline.     GCS: GCS eye subscore is 4. GCS verbal subscore is 5. GCS motor subscore is 6.     Cranial Nerves: No cranial nerve deficit.     Sensory: No sensory deficit.     Motor: Motor function is intact.  Psychiatric:        Attention and Perception: Attention normal.        Speech: Speech normal.        Behavior: Behavior normal.     ED Results / Procedures / Treatments   Labs (all labs ordered are listed, but only abnormal results are displayed) Labs Reviewed  MAGNESIUM  CBC  BASIC METABOLIC PANEL    EKG EKG Interpretation  Date/Time:  Sunday March 16 2022 13:01:32 EDT Ventricular Rate:  82 PR Interval:  57 QRS Duration: 117 QT Interval:  382 QTC Calculation: 447 R Axis:   89 Text Interpretation: Sinus rhythm Short PR interval Probable left atrial enlargement Nonspecific intraventricular conduction delay Low voltage, extremity leads Confirmed by Lacretia Leigh ([id number]) on 03/16/2022 1:15:25 PM  Radiology No results found.  Procedures Procedures    Medications Ordered in ED Medications - No data to display  ED Course/ Medical Decision Making/ A&P                          Chest x-ray per interpretation shows no acute findings. Patient's initial EKG per my interpretation shows A-fib.  Patient however spontaneously converted to normal sinus rhythm.  Patient's repeat EKG shows sinus rhythm and confirms this.  Patient's electrolytes including magnesium were within normal limits.  Consider anticoagulation but given that patient was only in A-fib for short period of time and with his low CHA2DS2-VASc score he will continue taking aspirin.  Patient instructed to call his cardiologist tomorrow for close follow-up.  He was monitored for several hours and did not go back into A-fib.  He is stable for discharge and is agreeable to this.    CRITICAL CARE Performed by: Leota Jacobsen Total critical care time: 50 minutes Critical care time was exclusive of  separately billable procedures and treating other patients. Critical care was necessary to treat or prevent imminent or life-threatening deterioration. Critical care was time spent personally by me on the following activities: development of treatment plan with patient and/or surrogate as well as nursing, discussions with consultants, evaluation  of patient's response to treatment, examination of patient, obtaining history from patient or surrogate, ordering and performing treatments and interventions, ordering and review of laboratory studies, ordering and review of radiographic studies, pulse oximetry and re-evaluation of patient's condition.        Final Clinical Impression(s) / ED Diagnoses Final diagnoses:  None    Rx / DC Orders ED Discharge Orders     None         Lacretia Leigh, MD 03/16/22 1503

## 2022-03-16 NOTE — ED Notes (Signed)
Patient reports taking flecainide at 0800, 1130, and 1230.

## 2022-03-16 NOTE — ED Notes (Signed)
ED Provider at bedside. 

## 2022-03-16 NOTE — ED Notes (Signed)
Patient ambulatory to restroom  ?

## 2022-03-16 NOTE — Discharge Instructions (Signed)
Call your cardiologist tomorrow to schedule a follow-up visit.  Return here at once if your heart rate begins to speed up and is irregular

## 2022-03-16 NOTE — ED Provider Triage Note (Signed)
Emergency Medicine Provider Triage Evaluation Note  Samuel Golden , a 61 y.o. male  was evaluated in triage.  Pt complains of lightheadedness and palpitations.  He states his symptoms started about 11:30 AM.  He has a history of atrial fibrillation and takes flecainide for this.  He states that he is not on any anticoagulation but does take a baby aspirin.  He states that he has been instructed to take additional flecainide tablets as needed for paroxysmal A-fib  He states he has not taken 3 doses of flecainide.  He denies any chest pain or difficulty breathing.  Review of Systems  Positive: Palpitations, LH Negative: CP  Physical Exam  There were no vitals taken for this visit. Gen:   Awake, no distress   Resp:  Normal effort  MSK:   Moves extremities without difficulty  Other:  Rapid HR  Medical Decision Making  Medically screening exam initiated at 12:39 PM.  Appropriate orders placed.  Samuel Golden was informed that the remainder of the evaluation will be completed by another provider, this initial triage assessment does not replace that evaluation, and the importance of remaining in the ED until their evaluation is complete.  Labs, CXR, EKG    Samuel Golden, [place] 03/16/22 1241

## 2022-03-16 NOTE — ED Notes (Signed)
Patient given apple juice and crackers.

## 2022-03-16 NOTE — ED Triage Notes (Signed)
Pt reports feeling like his heart is racing and reports light headedness, dizziness. Pt hx AFIB.

## 2024-06-26 ENCOUNTER — Other Ambulatory Visit: Payer: Self-pay

## 2024-06-26 ENCOUNTER — Emergency Department (HOSPITAL_COMMUNITY)
Admission: EM | Admit: 2024-06-26 | Discharge: 2024-06-26 | Disposition: A | Discharge status: Home / Self Care | Attending: Emergency Medicine | Admitting: Emergency Medicine

## 2024-06-26 ENCOUNTER — Encounter (HOSPITAL_COMMUNITY): Payer: Self-pay

## 2024-06-26 DIAGNOSIS — Z7982 Long term (current) use of aspirin: Secondary | ICD-10-CM | POA: Insufficient documentation

## 2024-06-26 DIAGNOSIS — W260XXA Contact with knife, initial encounter: Secondary | ICD-10-CM | POA: Insufficient documentation

## 2024-06-26 DIAGNOSIS — S61211A Laceration without foreign body of left index finger without damage to nail, initial encounter: Secondary | ICD-10-CM | POA: Insufficient documentation

## 2024-06-26 DIAGNOSIS — Z23 Encounter for immunization: Secondary | ICD-10-CM | POA: Insufficient documentation

## 2024-06-26 MED ORDER — TETANUS-DIPHTH-ACELL PERTUSSIS 5-2-15.5 LF-MCG/0.5 IM SUSP
0.5000 mL | Freq: Once | INTRAMUSCULAR | Status: AC
  Administered 2024-06-26: 0.5 mL via INTRAMUSCULAR
  Filled 2024-06-26: qty 0.5

## 2024-06-26 MED ORDER — LIDOCAINE HCL (PF) 1 % IJ SOLN
5.0000 mL | Freq: Once | INTRAMUSCULAR | Status: DC
  Filled 2024-06-26: qty 30

## 2024-06-26 MED ORDER — BACITRACIN ZINC 500 UNIT/GM EX OINT
TOPICAL_OINTMENT | Freq: Two times a day (BID) | CUTANEOUS | Status: DC
  Filled 2024-06-26: qty 0.9

## 2024-06-26 MED ORDER — LIDOCAINE-EPINEPHRINE-TETRACAINE (LET) TOPICAL GEL
3.0000 mL | Freq: Once | TOPICAL | Status: AC
  Administered 2024-06-26: 3 mL via TOPICAL
  Filled 2024-06-26: qty 3

## 2024-06-26 NOTE — ED Provider Notes (Signed)
 " [place] EMERGENCY DEPARTMENT AT [facility] Provider Note   CSN: [id number] Arrival date & time: 06/26/24  8144     Patient presents with: Finger Laceration   Samuel Golden is a 64 y.o. male. HPI Patient is a 64 year old male past medical history of Atrial fibrillation, HLD, GERD.  Not on blood thinners.  presenting ED today for concerns for a laceration to left palmar aspect of index finger while trying to sheath his kitchen knife.  Uncertain when his last Tdap was.  Denies any numbness, weakness, tingling.  Has full range of motion in finger.     Prior to Admission medications  Medication Sig Start Date End Date Taking? Authorizing Provider  Ascorbic Acid (VITAMIN C) 1000 MG tablet Take 1,000 mg by mouth daily.    [provider]  aspirin 325 MG EC tablet Take 325 mg by mouth daily.      [provider]  B Complex Vitamins (VITAMIN B COMPLEX PO) Take 1 tablet by mouth daily.    [provider]  Cholecalciferol (VITAMIN D) 2000 UNITS tablet Take 2,000 Units by mouth daily.    [provider]  co-enzyme Q-10 30 MG capsule Take 30 mg by mouth 3 (three) times daily.      [provider]  flecainide (TAMBOCOR) 100 MG tablet Take 100 mg by mouth 2 (two) times daily.    [provider]  GNP GARLIC EXTRACT PO Take 600 mg by mouth daily.    [provider]  HAWTHORNE BERRY PO Take 565 mg by mouth daily.    [provider]  LECITHIN PO Take 1 tablet by mouth daily. Pt takes 1360 mg    [provider]  MILK THISTLE-DAND-FENNEL-LICOR PO Take 480 mg by mouth daily.    [provider]  mometasone  (NASONEX ) 50 MCG/ACT nasal spray Place 2 sprays into the nose daily. 06/21/12   Paz, Jose E, MD  NON FORMULARY FENNEL.  Once daily.    [provider]  Omega-3 Fatty Acids (SALMON OIL PO) Take by mouth as directed. Pt takes 600 mg    [provider]  omeprazole (PRILOSEC OTC) 20 MG  tablet Take 20 mg by mouth daily.    [provider]  Red Yeast Rice Extract (RED YEAST RICE PO) Take 1 tablet by mouth daily. Pt takes 1200 mg    [provider]  saw palmetto 160 MG capsule Take 160 mg by mouth daily.    [provider]    Allergies: Codeine    Review of Systems  Skin:  Positive for wound.  All other systems reviewed and are negative.   Updated Vital Signs BP (!) 167/90   Pulse 62   Temp 98.4 F (36.9 C)   Resp 15   Ht 6' 2 (1.88 m)   Wt 90.7 kg   SpO2 100%   BMI 25.68 kg/m   Physical Exam Vitals and nursing note reviewed.  Constitutional:      General: He is not in acute distress.    Appearance: Normal appearance. He is not ill-appearing or diaphoretic.  HENT:     Head: Normocephalic and atraumatic.  Eyes:     General: No scleral icterus.       Right eye: No discharge.        Left eye: No discharge.     Extraocular Movements: Extraocular movements intact.     Conjunctiva/sclera: Conjunctivae normal.  Cardiovascular:     Rate  and Rhythm: Normal rate and regular rhythm.     Pulses: Normal pulses.     Heart sounds: Normal heart sounds. No murmur heard.    No friction rub. No gallop.  Pulmonary:     Effort: Pulmonary effort is normal. No respiratory distress.     Breath sounds: No stridor. No wheezing, rhonchi or rales.  Chest:     Chest wall: No tenderness.  Abdominal:     General: Abdomen is flat. There is no distension.     Palpations: Abdomen is soft.     Tenderness: There is no abdominal tenderness. There is no right CVA tenderness, left CVA tenderness, guarding or rebound.  Musculoskeletal:        General: Signs of injury (Notable does have approximately 3 cm to fatty pad of left index finger) present. No deformity.     Cervical back: Normal range of motion. No rigidity.     Right lower leg: No edema.     Left lower leg: No edema.  Skin:    General: Skin is warm and dry.     Findings: No bruising, erythema or  lesion.  Neurological:     General: No focal deficit present.     Mental Status: He is alert and oriented to person, place, and time. Mental status is at baseline.     Sensory: No sensory deficit.     Motor: No weakness.  Psychiatric:        Mood and Affect: Mood normal.     (all labs ordered are listed, but only abnormal results are displayed) Labs Reviewed - No data to display  EKG: None  Radiology: No results found.  .Laceration Repair  Date/Time: 06/26/2024 8:30 PM  Performed by: Beola Terrall RAMAN, PA-C Authorized by: Beola Terrall RAMAN, PA-C   Consent:    Consent obtained:  Verbal   Consent given by:  Patient and parent   Risks, benefits, and alternatives were discussed: yes     Risks discussed:  Infection, need for additional repair, nerve damage, poor cosmetic result, retained foreign body, tendon damage, poor wound healing, pain and vascular damage   Alternatives discussed:  No treatment and delayed treatment Universal protocol:    Procedure explained and questions answered to patient or proxy's satisfaction: yes     Relevant documents present and verified: yes     Immediately prior to procedure, a time out was called: yes     Patient identity confirmed:  Verbally with patient and arm band Anesthesia:    Anesthesia method:  Topical application   Topical anesthetic:  LET Laceration details:    Location:  Finger   Finger location:  L index finger   Length (cm):  3 Pre-procedure details:    Preparation:  Patient was prepped and draped in usual sterile fashion Exploration:    Hemostasis achieved with:  LET   Wound exploration: wound explored through full range of motion and entire depth of wound visualized     Wound extent: no foreign body, no signs of injury, no nerve damage and no tendon damage     Contaminated: no   Treatment:    Area cleansed with:  Povidone-iodine and saline   Amount of cleaning:  Standard   Irrigation solution:  Sterile saline   Irrigation  volume:  100   Irrigation method:  Syringe   Visualized foreign bodies/material removed: no   Skin repair:    Repair method:  Sutures   Suture size:  5-0  Suture material:  Prolene   Suture technique:  Simple interrupted   Number of sutures:  7 Approximation:    Approximation:  Close Repair type:    Repair type:  Simple Post-procedure details:    Dressing:  Non-adherent dressing   Procedure completion:  Tolerated well, no immediate complications    Medications Ordered in the ED  lidocaine  (PF) (XYLOCAINE ) 1 % injection 5 mL (5 mLs Infiltration Not Given 06/26/24 2026)  bacitracin  ointment (has no administration in time range)  Tdap (ADACEL ) injection 0.5 mL (0.5 mLs Intramuscular Given 06/26/24 1929)  lidocaine -EPINEPHrine -tetracaine  (LET) topical gel (3 mLs Topical Given 06/26/24 1939)    Medical Decision Making Risk Prescription drug management.   This patient is a 64 year old male who presents to the ED for concern of laceration to left index finger while trying to the sheet his knife.  Uncertain when his last Tdap was requesting 1 today.  On physical exam, patient is in no acute distress, afebrile, alert and orient x 4, speaking in full sentences, nontachypneic, nontachycardic.  Notably has approximately 3 cm laceration.  Involving fat pad of left index finger.  No tendon or muscular involvement.  Has full range of motion.  Normal sensation and good cap refill.  No other injuries noted otherwise.  Patient wound was from a clean knife.  Uncertain of tetanus shot.  Updated today.  Considered x-ray but do not believe it is warranted at this time  Laceration was repaired with 7 sutures after extensive cleaning.  With nonadherent dressing.  Provided care instructions.  Will have him return in 7 to 10 days for suture removal.  Patient vital signs have remained stable throughout the course of patient's time in the ED. Low suspicion for any other emergent pathology at this time. I  believe this patient is safe to be discharged. Provided strict return to ER precautions. Patient expressed agreement and understanding of plan. All questions were answered.  Differential diagnoses prior to evaluation: The emergent differential diagnosis includes, but is not limited to, fracture, ligamentous injury, neurovascular injury, dislocation, malalignment, retained foreign body. This is not an exhaustive differential.   Past Medical History / Co-morbidities / Social History: Atrial fibrillation, HLD, GERD  Additional history: Chart reviewed. Pertinent results include:   Last seen by-PCP on 02/26/2024   Medications: I ordered medication including let.  I have reviewed the patients home medicines and have made adjustments as needed.  Critical Interventions: None  Social Determinants of Health: None  Disposition: After consideration of the diagnostic results and the patients response to treatment, I feel that the patient would benefit from discharge and treatment as above.   emergency department workup does not suggest an emergent condition requiring admission or immediate intervention beyond what has been performed at this time. The plan is: Follow-up in 7-10 days, monitor for signs or symptoms of infection. The patient is safe for discharge and has been instructed to return immediately for worsening symptoms, change in symptoms or any other concerns.   Final diagnoses:  Laceration of left index finger without foreign body without damage to nail, initial encounter    ED Discharge Orders     None          Beola Terrall RAMAN, [place] 06/26/24 2034  "

## 2024-06-26 NOTE — ED Triage Notes (Signed)
 Patient was cutting food and a knife cut his left pointer finger. Unknown when tetanus was updated.

## 2024-06-26 NOTE — Discharge Instructions (Addendum)
 You are seen today for laceration of left index finger.  7 sutures were placed today.  Come back in the next 7 to 10 days for stitch removal or can follow-up PCP or urgent care as they will be also able to take out your stitches.  Clean daily with soap and water, keeping wound dry between cleanings.  Keeping the bandages dry.  Use nonadhesive dressing.  Monitor for signs of infection including fever, increased swelling, spreading redness over skin, pus present please return to ED for further evaluation.
